# Patient Record
Sex: Female | Born: 1970 | Race: Black or African American | Hispanic: No | Marital: Married | Smoking: Never smoker
Health system: Southern US, Community
[De-identification: ages and names within clinical notes are randomized; demographics above are authoritative.]

## PROBLEM LIST (undated history)

## (undated) ENCOUNTER — Ambulatory Visit: Admission: EM | Payer: 59

## (undated) DIAGNOSIS — M1990 Unspecified osteoarthritis, unspecified site: Secondary | ICD-10-CM

## (undated) DIAGNOSIS — I10 Essential (primary) hypertension: Secondary | ICD-10-CM

## (undated) DIAGNOSIS — N200 Calculus of kidney: Secondary | ICD-10-CM

## (undated) DIAGNOSIS — E785 Hyperlipidemia, unspecified: Secondary | ICD-10-CM

## (undated) DIAGNOSIS — G8929 Other chronic pain: Secondary | ICD-10-CM

## (undated) DIAGNOSIS — J45909 Unspecified asthma, uncomplicated: Secondary | ICD-10-CM

## (undated) HISTORY — PX: ABDOMINAL HYSTERECTOMY: SHX81

## (undated) HISTORY — DX: Hyperlipidemia, unspecified: E78.5

## (undated) HISTORY — DX: Essential (primary) hypertension: I10

## (undated) HISTORY — DX: Calculus of kidney: N20.0

## (undated) HISTORY — DX: Other chronic pain: G89.29

---

## 2016-05-02 HISTORY — PX: ABDOMINAL HYSTERECTOMY: SHX81

## 2018-10-08 LAB — CBC AND DIFFERENTIAL
HCT: 35 — AB (ref 36–46)
Hemoglobin: 11.7 — AB (ref 12.0–16.0)
Neutrophils Absolute: 4
Platelets: 347 (ref 150–399)
WBC: 7.6

## 2018-10-08 LAB — HEPATIC FUNCTION PANEL
ALT: 6 — AB (ref 7–35)
AST: 15 (ref 13–35)
Bilirubin, Total: 0.2

## 2018-10-08 LAB — LIPID PANEL
Cholesterol: 227 — AB (ref 0–200)
HDL: 62 (ref 35–70)
LDL Cholesterol: 143
Triglycerides: 110 (ref 40–160)

## 2018-10-08 LAB — BASIC METABOLIC PANEL
BUN: 11 (ref 4–21)
CO2: 21 (ref 13–22)
Chloride: 103 (ref 99–108)
Creatinine: 0.8 (ref 0.5–1.1)
Potassium: 3.8 (ref 3.4–5.3)
Sodium: 139 (ref 137–147)

## 2018-10-08 LAB — HEMOGLOBIN A1C: Hemoglobin A1C: 6

## 2018-10-08 LAB — CBC: RBC: 4.17 (ref 3.87–5.11)

## 2018-10-08 LAB — VITAMIN B12: Vitamin B-12: 342

## 2018-10-08 LAB — TSH: TSH: 3.61 (ref 0.41–5.90)

## 2018-10-17 LAB — HM MAMMOGRAPHY

## 2019-12-10 ENCOUNTER — Other Ambulatory Visit: Payer: Self-pay

## 2019-12-10 ENCOUNTER — Ambulatory Visit (INDEPENDENT_AMBULATORY_CARE_PROVIDER_SITE_OTHER): Payer: No Typology Code available for payment source

## 2019-12-10 ENCOUNTER — Ambulatory Visit
Admission: RE | Admit: 2019-12-10 | Discharge: 2019-12-10 | Disposition: A | Discharge status: Home / Self Care | Payer: No Typology Code available for payment source | Source: Ambulatory Visit | Attending: Emergency Medicine | Admitting: Emergency Medicine

## 2019-12-10 VITALS — BP 130/95 | HR 94 | Temp 98.9°F | Resp 18 | Ht 69.0 in | Wt 245.0 lb

## 2019-12-10 DIAGNOSIS — N200 Calculus of kidney: Secondary | ICD-10-CM

## 2019-12-10 DIAGNOSIS — M545 Low back pain, unspecified: Secondary | ICD-10-CM

## 2019-12-10 LAB — URINALYSIS, COMPLETE (UACMP) WITH MICROSCOPIC
Bilirubin Urine: NEGATIVE
Glucose, UA: NEGATIVE mg/dL
Ketones, ur: NEGATIVE mg/dL
Leukocytes,Ua: NEGATIVE
Nitrite: NEGATIVE
Protein, ur: 100 mg/dL — AB
Specific Gravity, Urine: 1.02 (ref 1.005–1.030)
pH: 5.5 (ref 5.0–8.0)

## 2019-12-10 MED ORDER — KETOROLAC TROMETHAMINE 60 MG/2ML IM SOLN
30.0000 mg | Freq: Once | INTRAMUSCULAR | Status: AC
  Administered 2019-12-10: 30 mg via INTRAMUSCULAR

## 2019-12-10 MED ORDER — IBUPROFEN 600 MG PO TABS
600.0000 mg | ORAL_TABLET | Freq: Four times a day (QID) | ORAL | 0 refills | Status: DC | PRN
Start: 2019-12-10 — End: 2019-12-24

## 2019-12-10 MED ORDER — ACETAMINOPHEN 500 MG PO TABS
1000.0000 mg | ORAL_TABLET | Freq: Once | ORAL | Status: AC
  Administered 2019-12-10: 1000 mg via ORAL

## 2019-12-10 MED ORDER — TAMSULOSIN HCL 0.4 MG PO CAPS
0.4000 mg | ORAL_CAPSULE | Freq: Every day | ORAL | 0 refills | Status: AC
Start: 2019-12-10 — End: 2019-12-17

## 2019-12-10 NOTE — ED Triage Notes (Signed)
Patient c/o left side back pain that radiates into her left leg x 1 week. She denies injury.

## 2019-12-10 NOTE — Discharge Instructions (Addendum)
Take Tylenol 1000 mg with 600 mg of ibuprofen together 3-4 times a day as needed for pain.  Flomax will help you pass stones.  Strain your urine.  Follow-up with urology if you continue to have problems.  Push fluids.  Start MiraLAX, Colace.  This will help with your constipation.

## 2019-12-10 NOTE — ED Provider Notes (Signed)
HPI  SUBJECTIVE:  Kendra Ford is a 49 y.o. female who presents with 6 days of left lower back pain that has migrated down her left flank to the suprapubic/left lower quadrant region starting 6 days ago.  She states that she has had pain radiating down her lateral leg 3 days ago.  She states it is throbbing, constant, but it waxes and wanes.  She describes the pain in the suprapubic/left lower quadrant region as a cramp.  She reports urinary frequency, cloudy urine.  She reports nausea and one episode of emesis.  No dysuria, urgency, odorous urine, hematuria.  No fevers.  No saddle anesthesia, urinary, fecal incontinence, urinary retention.  No bilateral radicular pain, leg weakness.  No trauma to her back.  No change in her physical activities, recent heavy lifting.  She has ongoing issues with constipation.  had a bowel movement yesterday and states that it made the back pain better.  No antipyretic in the past 6 hours.  She has tried Flexeril and Tylenol without improvement in her symptoms.  Symptoms are better with defecating, walking, warm compresses, worse with sitting.  No pain worse at night, unintentional weight loss, night sweats.  She has had similar pain before, but is usually on the right/midline.  She has a history of back injury, she is status post hysterectomy.  She has a history of constipation, hypertension, UTI, nephrolithiasis.  No history of diabetes, pyelonephritis, cancer, multiple myeloma, aortic abdominal aneurysm.  PMD: She is establishing care with somebody in several weeks.    History reviewed. No pertinent past medical history.  Past Surgical History:  Procedure Laterality Date  . ABDOMINAL HYSTERECTOMY      History reviewed. No pertinent family history.  Social History   Tobacco Use  . Smoking status: Never Smoker  . Smokeless tobacco: Never Used  Vaping Use  . Vaping Use: Never used  Substance Use Topics  . Alcohol use: Never  . Drug use: Never    No  current facility-administered medications for this encounter.  Current Outpatient Medications:  .  ibuprofen (ADVIL) 600 MG tablet, Take 1 tablet (600 mg total) by mouth every 6 (six) hours as needed., Disp: 30 tablet, Rfl: 0 .  tamsulosin (FLOMAX) 0.4 MG CAPS capsule, Take 1 capsule (0.4 mg total) by mouth at bedtime for 7 days., Disp: 7 capsule, Rfl: 0  Allergies  Allergen Reactions  . Aspirin   . Penicillins      ROS  As noted in HPI.   Physical Exam  BP (!) 130/95 (BP Location: Right Arm)   Pulse 94   Temp 98.9 F (37.2 C) (Oral)   Resp 18   Ht _0  (1.753 m)   Wt 111.1 kg   SpO2 100%   BMI 36.18 kg/m   Constitutional: Well developed, well nourished, no acute distress Eyes:  EOMI, conjunctiva normal bilaterally HENT: Normocephalic, atraumatic,mucus membranes moist Respiratory: Normal inspiratory effort Cardiovascular: Normal rate GI: nondistended. No suprapubic tenderness.  Positive left flank, left lower quadrant tenderness.  No guarding, rebound skin: No rash, skin intact Musculoskeletal: Questionable left CVAT. + Left paralumbar tenderness,  - muscle spasm. No bony tenderness. Bilateral lower extremities nontender, baseline ROM with intact DP  pulses, no pain with int/ext rotation flex/extension hips bilaterally except pain aggravated with left hip flexion against resistance.. SLR neg bilaterally. Sensation baseline light touch bilaterally for Pt, DTR's symmetric and intact bilaterally KJ , Motor symmetric bilateral 5/5 hip flexion, quadriceps, hamstrings, EHL, foot dorsiflexion, foot plantarflexion,  gait normal  neurologic: Alert & oriented x 3, no focal neuro deficits Psychiatric: Speech and behavior appropriate   ED Course   Medications  acetaminophen (TYLENOL) tablet 1,000 mg (1,000 mg Oral Given 12/10/19 1032)  ketorolac (TORADOL) injection 30 mg (30 mg Intramuscular Given 12/10/19 1031)    Orders Placed This Encounter  Procedures  . Urine culture     Standing Status:   Standing    Number of Occurrences:   1  . DG Abdomen 1 View    Standing Status:   Standing    Number of Occurrences:   1    Order Specific Question:   Reason for Exam (SYMPTOM  OR DIAGNOSIS REQUIRED)    Answer:   r/o L nephrolithiasis    Order Specific Question:   Is patient pregnant?    Answer:   No  . Urinalysis, Complete w Microscopic    Standing Status:   Standing    Number of Occurrences:   1  . Strain all urine    Send pt home with urine strainer    Results for orders placed or performed during the hospital encounter of 12/10/19 (from the past 24 hour(s))  Urinalysis, Complete w Microscopic Urine, Clean Catch     Status: Abnormal   Collection Time: 12/10/19 10:39 AM  Result Value Ref Range   Color, Urine YELLOW YELLOW   APPearance HAZY (A) CLEAR   Specific Gravity, Urine 1.020 1.005 - 1.030   pH 5.5 5.0 - 8.0   Glucose, UA NEGATIVE NEGATIVE mg/dL   Hgb urine dipstick MODERATE (A) NEGATIVE   Bilirubin Urine NEGATIVE NEGATIVE   Ketones, ur NEGATIVE NEGATIVE mg/dL   Protein, ur 100 (A) NEGATIVE mg/dL   Nitrite NEGATIVE NEGATIVE   Leukocytes,Ua NEGATIVE NEGATIVE   Squamous Epithelial / LPF 6-10 0 - 5   WBC, UA 6-10 0 - 5 WBC/hpf   RBC / HPF 6-10 0 - 5 RBC/hpf   Bacteria, UA FEW (A) NONE SEEN   WBC Clumps PRESENT    Mucus PRESENT    DG Abdomen 1 View  Result Date: 12/10/2019 CLINICAL DATA:  Left flank pain for 1 week. EXAM: ABDOMEN - 1 VIEW COMPARISON:  None. FINDINGS: Moderate stool in the right colon. Moderate gas in the splenic flexure region. No dilated loops of small bowel to suggest obstruction. The soft tissue shadows of the abdomen are maintained. Suspect bilateral renal calculi. Calcifications along the left side of the lower spine appear to have lucent centers and are likely phleboliths in the ovarian vein. These are un likely ureteral calculi. The lung bases are clear. The heart appears mildly enlarged. Intact bony structures. IMPRESSION: 1.  Suspect bilateral renal calculi. 2. Probable phleboliths in the left ovarian vein. 3. No findings for obstruction or perforation. Electronically Signed   By: Marijo Sanes M.D.   On: 12/10/2019 10:51    ED Clinical Impression  1. Nephrolithiasis   2. Acute left-sided low back pain without sciatica     ED Assessment/Plan   Appears uncomfortable.  She has questionable left CVA tenderness.  She has left paralumbar tenderness, flank tenderness, suprapubic tenderness.  Mild left lateral thigh tenderness.  It seems to musculoskeletal as movement of the hip reproduces her pain however she also has urinary complaints.  She also states that her symptoms get better with defecation so I wonder if there is not a component of constipation.  We will get a KUB to evaluate constipation and for nephrolithiasis, a UA.  Doubt  obstructing nephrolithiasis, perforation.  Do not think that we need a CT at this point in time.  Giving Toradol 30 mg IM and Tylenol 1000 mg p.o.  Will reevaluate.  Imaging independently reviewed.  Suspected bilateral renal calculi.  Probable phleboliths in left ovarian vein.  No evidence of obstruction, perforation see radiology report for details  On reevaluation, patient states her symptoms have significantly resolved with Toradol/Tylenol.  She feels much better.  She has moderate hematuria and proteinuria in her urine.  She has a few bacteria, but this was contaminated specimen.  We will send this off for culture prior to initiating antibiotic therapy.  suspect multiple factors causing her symptoms-primarily nonobstructing nephrolithiasis with musculoskeletal back pain . sounds as if there is some constipation as well.  Will send home with Flomax, IBU/APAP together 3-4 times a day, strain urine, MiraLAX, advise Colace.  Push fluids.  Pt to f/u with  PMD and urology as necessary.  Discussed labs, medical decision-making, and plan for follow-up with the patient.  Discussed signs and  symptoms that should prompt return to the emergency department.  Patient agrees with plan.  Meds ordered this encounter  Medications  . acetaminophen (TYLENOL) tablet 1,000 mg  . ketorolac (TORADOL) injection 30 mg  . tamsulosin (FLOMAX) 0.4 MG CAPS capsule    Sig: Take 1 capsule (0.4 mg total) by mouth at bedtime for 7 days.    Dispense:  7 capsule    Refill:  0  . ibuprofen (ADVIL) 600 MG tablet    Sig: Take 1 tablet (600 mg total) by mouth every 6 (six) hours as needed.    Dispense:  30 tablet    Refill:  0    *This clinic note was created using [employer]. Therefore, there may be occasional mistakes despite careful proofreading.  ?     Melynda Ripple, MD 12/11/19 1437

## 2019-12-11 LAB — URINE CULTURE

## 2019-12-16 ENCOUNTER — Telehealth (HOSPITAL_COMMUNITY): Payer: Self-pay | Admitting: Emergency Medicine

## 2019-12-16 NOTE — Telephone Encounter (Signed)
Per patient's urine culture, multiple species present.  Called to check in on patient who states the pain has resolved, but she is continuing to have urinary frequency.  This RN encouraged her to return to provide urine sample for recollection.  States she will be here in the morning of 12/17/19.

## 2019-12-19 ENCOUNTER — Ambulatory Visit
Admission: EM | Admit: 2019-12-19 | Discharge: 2019-12-19 | Disposition: A | Discharge status: Home / Self Care | Payer: No Typology Code available for payment source | Attending: Internal Medicine | Admitting: Internal Medicine

## 2019-12-19 DIAGNOSIS — N200 Calculus of kidney: Secondary | ICD-10-CM | POA: Diagnosis present

## 2019-12-19 NOTE — ED Triage Notes (Signed)
Patient here for urine collection only. Was called back to come in for another urine specimen.

## 2019-12-21 LAB — URINE CULTURE

## 2019-12-24 ENCOUNTER — Ambulatory Visit (INDEPENDENT_AMBULATORY_CARE_PROVIDER_SITE_OTHER): Payer: No Typology Code available for payment source | Admitting: Internal Medicine

## 2019-12-24 ENCOUNTER — Other Ambulatory Visit: Payer: Self-pay

## 2019-12-24 ENCOUNTER — Telehealth: Payer: Self-pay

## 2019-12-24 ENCOUNTER — Encounter: Payer: Self-pay | Admitting: Internal Medicine

## 2019-12-24 VITALS — BP 130/70 | HR 76 | Temp 98.2°F | Ht 69.0 in | Wt 241.0 lb

## 2019-12-24 DIAGNOSIS — K5904 Chronic idiopathic constipation: Secondary | ICD-10-CM | POA: Insufficient documentation

## 2019-12-24 DIAGNOSIS — Z6835 Body mass index (BMI) 35.0-35.9, adult: Secondary | ICD-10-CM | POA: Insufficient documentation

## 2019-12-24 DIAGNOSIS — E782 Mixed hyperlipidemia: Secondary | ICD-10-CM

## 2019-12-24 DIAGNOSIS — K5900 Constipation, unspecified: Secondary | ICD-10-CM

## 2019-12-24 DIAGNOSIS — Z1211 Encounter for screening for malignant neoplasm of colon: Secondary | ICD-10-CM

## 2019-12-24 DIAGNOSIS — M1712 Unilateral primary osteoarthritis, left knee: Secondary | ICD-10-CM | POA: Insufficient documentation

## 2019-12-24 DIAGNOSIS — Z87442 Personal history of urinary calculi: Secondary | ICD-10-CM | POA: Diagnosis not present

## 2019-12-24 DIAGNOSIS — J4520 Mild intermittent asthma, uncomplicated: Secondary | ICD-10-CM | POA: Insufficient documentation

## 2019-12-24 DIAGNOSIS — R5383 Other fatigue: Secondary | ICD-10-CM

## 2019-12-24 DIAGNOSIS — Z87898 Personal history of other specified conditions: Secondary | ICD-10-CM | POA: Insufficient documentation

## 2019-12-24 NOTE — Telephone Encounter (Signed)
Precharted patients medications, history, social history, and allergies before her NP visit today.   Confirmed her appt time with her. She is new to [place], and Moved from [place] this past June 2021.  CM

## 2019-12-24 NOTE — Progress Notes (Signed)
Date:  12/24/2019   Name:  Kendra Ford   DOB:  [birth year]   MRN:  [id number]  New patient to establish care. Chief Complaint: Establish Care (New pt in [place]. From [place]. )  Hypertension This is a new problem. The problem has been rapidly improving since onset. The problem is controlled. Associated symptoms include palpitations. Pertinent negatives include no chest pain, headaches or shortness of breath. Past treatments include lifestyle changes. The current treatment provides significant improvement.  Hyperlipidemia This is a chronic problem. Pertinent negatives include no chest pain or shortness of breath. She is currently on no antihyperlipidemic treatment (previously on medication but stopped it).  Asthma She complains of chest tightness and wheezing. There is no shortness of breath. This is a recurrent problem. The problem occurs rarely. Pertinent negatives include no chest pain, fever or headaches. Her symptoms are not alleviated by beta-agonist. Her past medical history is significant for asthma.  Renal stones - recently in UC, given pain meds, pain resolved so she believes that she passed a stone.  No hematuria or cloudy urine.   Immunization History  Administered Date(s) Administered   PFIZER SARS-COV-2 Vaccination 12/01/2019, 12/20/2019  patient is not interested in Shingrix vaccine.   Review of Systems  Constitutional: Positive for fatigue. Negative for chills, fever and unexpected weight change.  Respiratory: Positive for wheezing. Negative for chest tightness and shortness of breath.   Cardiovascular: Positive for palpitations. Negative for chest pain and leg swelling.  Gastrointestinal: Positive for constipation. Negative for abdominal pain and diarrhea.  Musculoskeletal: Positive for arthralgias (left knee).  Neurological: Negative for dizziness, light-headedness and headaches.  Psychiatric/Behavioral: Negative for dysphoric mood and sleep disturbance. The patient  is not nervous/anxious.     Patient Active Problem List   Diagnosis Date Noted   History of renal calculi 12/24/2019    Allergies  Allergen Reactions   Aspirin Hives   Penicillins Hives    Past Surgical History:  Procedure Laterality Date   ABDOMINAL HYSTERECTOMY  2018   Partial - still have ovaries    Social History   Tobacco Use   Smoking status: Never Smoker   Smokeless tobacco: Never Used  Vaping Use   Vaping Use: Never used  Substance Use Topics   Alcohol use: Never   Drug use: Never     Medication list has been reviewed and updated.  No outpatient medications have been marked as taking for the 12/24/19 encounter (Office Visit) with Glean Hess, MD.    [facility] 2/9 Scores 12/24/2019  PHQ - 2 Score 0  PHQ- 9 Score 6    GAD 7 : Generalized Anxiety Score 12/24/2019  Nervous, Anxious, on Edge 0  Control/stop worrying 0  Worry too much - different things 0  Trouble relaxing 0  Restless 0  Easily annoyed or irritable 0  Afraid - awful might happen 0  Total GAD 7 Score 0  Anxiety Difficulty Not difficult at all    BP Readings from Last 3 Encounters:  12/24/19 130/70  12/10/19 (!) 130/95    Physical Exam Vitals and nursing note reviewed.  Constitutional:      General: She is not in acute distress.    Appearance: Normal appearance. She is well-developed.  HENT:     Head: Normocephalic and atraumatic.  Neck:     Vascular: No carotid bruit.  Cardiovascular:     Rate and Rhythm: Normal rate and regular rhythm. Frequent extrasystoles are present.    Pulses: Normal  pulses.     Heart sounds: No murmur heard.   Pulmonary:     Effort: Pulmonary effort is normal. No respiratory distress.     Breath sounds: No wheezing or rhonchi.  Musculoskeletal:     Cervical back: Normal range of motion.     Right knee: No effusion or bony tenderness. Normal range of motion. No tenderness.     Left knee: Effusion present. No bony tenderness. Normal range of  motion. No tenderness.     Right lower leg: No edema.     Left lower leg: No edema.  Lymphadenopathy:     Cervical: No cervical adenopathy.  Skin:    General: Skin is warm and dry.     Findings: No rash.  Neurological:     General: No focal deficit present.     Mental Status: She is alert and oriented to person, place, and time.  Psychiatric:        Attention and Perception: Attention normal.        Mood and Affect: Mood normal.        Behavior: Behavior normal.        Thought Content: Thought content normal.     Wt Readings from Last 3 Encounters:  12/24/19 241 lb (109.3 kg)  12/10/19 245 lb (111.1 kg)    BP 130/70    Pulse 76    Temp 98.2 F (36.8 C) (Oral)    Ht 5\' 9"  (1.753 m)    Wt 241 lb (109.3 kg)    SpO2 98%    BMI 35.59 kg/m   Assessment and Plan: 1. History of renal calculi Appears to have passed a stone and is now asx  2. Mild intermittent asthma without complication Continue albuterol inhaler as needed - Comprehensive metabolic panel  3. Constipation, unspecified constipation type Recommend Miralax daily Refer for Colonoscopy  4. Primary osteoarthritis of left knee Tylenol PRN  5. Mixed hyperlipidemia Check labs and advise - Lipid panel  6. Colon cancer screening - Ambulatory referral to Gastroenterology  7. Fatigue, unspecified type - CBC with Differential/Platelet - TSH  8. BMI 35.0-35.9,adult Recommend continuing work on diet; begin regular exercise such as walking during lunch daily  HM - will refer for Mammogram once previous films are received.  Partially dictated using Editor, commissioning. Any errors are unintentional.  Halina Maidens, MD [facility]  12/24/2019

## 2019-12-25 LAB — COMPREHENSIVE METABOLIC PANEL
ALT: 4 IU/L (ref 0–32)
AST: 13 IU/L (ref 0–40)
Albumin/Globulin Ratio: 1.1 — ABNORMAL LOW (ref 1.2–2.2)
Albumin: 3.8 g/dL (ref 3.8–4.9)
Alkaline Phosphatase: 68 IU/L (ref 48–121)
BUN/Creatinine Ratio: 12 (ref 9–23)
BUN: 11 mg/dL (ref 6–24)
Bilirubin Total: <0.2 mg/dL (ref 0.0–1.2)
CO2: 24 mmol/L (ref 20–29)
Calcium: 9.5 mg/dL (ref 8.7–10.2)
Chloride: 105 mmol/L (ref 96–106)
Creatinine, Ser: 0.89 mg/dL (ref 0.57–1.00)
GFR calc Af Amer: 87 mL/min/{1.73_m2} (ref >59)
GFR calc non Af Amer: 75 mL/min/{1.73_m2} (ref >59)
Globulin, Total: 3.4 g/dL (ref 1.5–4.5)
Glucose: 97 mg/dL (ref 65–99)
Potassium: 4.6 mmol/L (ref 3.5–5.2)
Sodium: 142 mmol/L (ref 134–144)
Total Protein: 7.2 g/dL (ref 6.0–8.5)

## 2019-12-25 LAB — CBC WITH DIFFERENTIAL/PLATELET
Basophils Absolute: 0 10*3/uL (ref 0.0–0.2)
Basos: 0 %
EOS (ABSOLUTE): 0.1 10*3/uL (ref 0.0–0.4)
Eos: 2 %
Hematocrit: 34.8 % (ref 34.0–46.6)
Hemoglobin: 11.3 g/dL (ref 11.1–15.9)
Immature Grans (Abs): 0 10*3/uL (ref 0.0–0.1)
Immature Granulocytes: 0 %
Lymphocytes Absolute: 2.4 10*3/uL (ref 0.7–3.1)
Lymphs: 37 %
MCH: 27.6 pg (ref 26.6–33.0)
MCHC: 32.5 g/dL (ref 31.5–35.7)
MCV: 85 fL (ref 79–97)
Monocytes Absolute: 0.4 10*3/uL (ref 0.1–0.9)
Monocytes: 7 %
Neutrophils Absolute: 3.6 10*3/uL (ref 1.4–7.0)
Neutrophils: 54 %
Platelets: 353 10*3/uL (ref 150–450)
RBC: 4.1 x10E6/uL (ref 3.77–5.28)
RDW: 14.7 % (ref 11.7–15.4)
WBC: 6.6 10*3/uL (ref 3.4–10.8)

## 2019-12-25 LAB — LIPID PANEL
Chol/HDL Ratio: 3.7 ratio (ref 0.0–4.4)
Cholesterol, Total: 229 mg/dL — ABNORMAL HIGH (ref 100–199)
HDL: 62 mg/dL (ref >39)
LDL Chol Calc (NIH): 151 mg/dL — ABNORMAL HIGH (ref 0–99)
Triglycerides: 93 mg/dL (ref 0–149)
VLDL Cholesterol Cal: 16 mg/dL (ref 5–40)

## 2019-12-25 LAB — TSH: TSH: 2.18 u[IU]/mL (ref 0.450–4.500)

## 2020-01-01 ENCOUNTER — Other Ambulatory Visit: Payer: Self-pay

## 2020-01-01 ENCOUNTER — Telehealth (INDEPENDENT_AMBULATORY_CARE_PROVIDER_SITE_OTHER): Payer: Self-pay | Admitting: Gastroenterology

## 2020-01-01 DIAGNOSIS — Z1211 Encounter for screening for malignant neoplasm of colon: Secondary | ICD-10-CM

## 2020-01-01 MED ORDER — PEG 3350-KCL-NA BICARB-NACL 420 G PO SOLR
4000.0000 mL | Freq: Once | ORAL | 0 refills | Status: AC
Start: 2020-01-01 — End: 2020-01-01

## 2020-01-01 NOTE — Progress Notes (Signed)
Gastroenterology Pre-Procedure Review  Request Date: Friday 01/24/20 Requesting Physician: Dr. Allen Norris  PATIENT REVIEW QUESTIONS: The patient responded to the following health history questions as indicated:    1. Are you having any GI issues? no 2. Do you have a personal history of Polyps? no 3. Do you have a family history of Colon Cancer or Polyps? no 4. Diabetes Mellitus? no 5. Joint replacements in the past 12 months?no 6. Major health problems in the past 3 months?no 7. Any artificial heart valves, MVP, or defibrillator?no    MEDICATIONS & ALLERGIES:    Patient reports the following regarding taking any anticoagulation/antiplatelet therapy:   Plavix, Coumadin, Eliquis, Xarelto, Lovenox, Pradaxa, Brilinta, or Effient? no Aspirin? no  Patient confirms/reports the following medications:  Current Outpatient Medications  Medication Sig Dispense Refill  . polyethylene glycol-electrolytes (NULYTELY) 420 g solution Take 4,000 mLs by mouth once for 1 dose. 4000 mL 0   No current facility-administered medications for this visit.    Patient confirms/reports the following allergies:  Allergies  Allergen Reactions  . Aspirin Hives  . Penicillins Hives    No orders of the defined types were placed in this encounter.   AUTHORIZATION INFORMATION Primary Insurance: 1D#: Group #:  Secondary Insurance: 1D#: Group #:  SCHEDULE INFORMATION: Date: Friday 01/24/20 Time: Location:[facility]

## 2020-01-16 ENCOUNTER — Encounter: Payer: Self-pay | Admitting: Gastroenterology

## 2020-01-16 NOTE — Anesthesia Preprocedure Evaluation (Addendum)
Anesthesia Evaluation  Patient identified by MRN, date of birth, ID band Patient awake    Reviewed: Allergy & Precautions, NPO status , Patient's Chart, lab work & pertinent test results, reviewed documented beta blocker date and time   History of Anesthesia Complications Negative for: history of anesthetic complications  Airway Mallampati: III  TM Distance: >3 FB Neck ROM: Full    Dental   Pulmonary asthma ,    breath sounds clear to auscultation       Cardiovascular hypertension, (-) angina(-) DOE  Rhythm:Regular Rate:Normal   HLD   Neuro/Psych    GI/Hepatic neg GERD  ,  Endo/Other    Renal/GU Renal disease (Stones)     Musculoskeletal  (+) Arthritis ,   Abdominal (+) + obese (BMI 35),   Peds  Hematology   Anesthesia Other Findings   Reproductive/Obstetrics                            Anesthesia Physical Anesthesia Plan  ASA: II  Anesthesia Plan: General   Post-op Pain Management:    Induction: Intravenous  PONV Risk Score and Plan: 3 and Propofol infusion, TIVA and Treatment may vary due to age or medical condition  Airway Management Planned: Natural Airway and Nasal Cannula  Additional Equipment:   Intra-op Plan:   Post-operative Plan:   Informed Consent: I have reviewed the patients History and Physical, chart, labs and discussed the procedure including the risks, benefits and alternatives for the proposed anesthesia with the patient or authorized representative who has indicated his/her understanding and acceptance.       Plan Discussed with: CRNA and Anesthesiologist  Anesthesia Plan Comments:         Anesthesia Quick Evaluation

## 2020-01-22 ENCOUNTER — Other Ambulatory Visit
Admission: RE | Admit: 2020-01-22 | Discharge: 2020-01-22 | Disposition: A | Discharge status: Home / Self Care | Payer: No Typology Code available for payment source | Source: Ambulatory Visit | Attending: Gastroenterology | Admitting: Gastroenterology

## 2020-01-22 ENCOUNTER — Other Ambulatory Visit: Payer: Self-pay

## 2020-01-22 DIAGNOSIS — Z01812 Encounter for preprocedural laboratory examination: Secondary | ICD-10-CM | POA: Insufficient documentation

## 2020-01-22 DIAGNOSIS — Z20822 Contact with and (suspected) exposure to covid-19: Secondary | ICD-10-CM | POA: Diagnosis not present

## 2020-01-22 LAB — SARS CORONAVIRUS 2 (TAT 6-24 HRS): SARS Coronavirus 2: NEGATIVE

## 2020-01-23 NOTE — Discharge Instructions (Signed)
General Anesthesia, Adult, Care After This sheet gives you information about how to care for yourself after your procedure. Your health care provider may also give you more specific instructions. If you have problems or questions, contact your health care provider. What can I expect after the procedure? After the procedure, the following side effects are common:  Pain or discomfort at the IV site.  Nausea.  Vomiting.  Sore throat.  Trouble concentrating.  Feeling cold or chills.  Weak or tired.  Sleepiness and fatigue.  Soreness and body aches. These side effects can affect parts of the body that were not involved in surgery. Follow these instructions at home:  For at least 24 hours after the procedure:  Have a responsible adult stay with you. It is important to have someone help care for you until you are awake and alert.  Rest as needed.  Do not: ? Participate in activities in which you could fall or become injured. ? Drive. ? Use heavy machinery. ? Drink alcohol. ? Take sleeping pills or medicines that cause drowsiness. ? Make important decisions or sign legal documents. ? Take care of children on your own. Eating and drinking  Follow any instructions from your health care provider about eating or drinking restrictions.  When you feel hungry, start by eating small amounts of foods that are soft and easy to digest (bland), such as toast. Gradually return to your regular diet.  Drink enough fluid to keep your urine pale yellow.  If you vomit, rehydrate by drinking water, juice, or clear broth. General instructions  If you have sleep apnea, surgery and certain medicines can increase your risk for breathing problems. Follow instructions from your health care provider about wearing your sleep device: ? Anytime you are sleeping, including during daytime naps. ? While taking prescription pain medicines, sleeping medicines, or medicines that make you drowsy.  Return to  your normal activities as told by your health care provider. Ask your health care provider what activities are safe for you.  Take over-the-counter and prescription medicines only as told by your health care provider.  If you smoke, do not smoke without supervision.  Keep all follow-up visits as told by your health care provider. This is important. Contact a health care provider if:  You have nausea or vomiting that does not get better with medicine.  You cannot eat or drink without vomiting.  You have pain that does not get better with medicine.  You are unable to pass urine.  You develop a skin rash.  You have a fever.  You have redness around your IV site that gets worse. Get help right away if:  You have difficulty breathing.  You have chest pain.  You have blood in your urine or stool, or you vomit blood. Summary  After the procedure, it is common to have a sore throat or nausea. It is also common to feel tired.  Have a responsible adult stay with you for the first 24 hours after general anesthesia. It is important to have someone help care for you until you are awake and alert.  When you feel hungry, start by eating small amounts of foods that are soft and easy to digest (bland), such as toast. Gradually return to your regular diet.  Drink enough fluid to keep your urine pale yellow.  Return to your normal activities as told by your health care provider. Ask your health care provider what activities are safe for you. This information is not   intended to replace advice given to you by your health care provider. Make sure you discuss any questions you have with your health care provider. Document Revised: 04/21/2017 Document Reviewed: 12/02/2016 Elsevier Patient Education  2020 [employer]  

## 2020-01-24 ENCOUNTER — Ambulatory Visit
Admission: RE | Admit: 2020-01-24 | Discharge: 2020-01-24 | Disposition: A | Discharge status: Home / Self Care | Payer: 59 | Attending: Gastroenterology | Admitting: Gastroenterology

## 2020-01-24 ENCOUNTER — Ambulatory Visit: Payer: 59 | Admitting: Anesthesiology

## 2020-01-24 ENCOUNTER — Encounter
Admission: RE | Disposition: A | Discharge status: Home / Self Care | Payer: Self-pay | Source: Home / Self Care | Attending: Gastroenterology

## 2020-01-24 ENCOUNTER — Encounter: Payer: Self-pay | Admitting: Gastroenterology

## 2020-01-24 ENCOUNTER — Other Ambulatory Visit: Payer: Self-pay

## 2020-01-24 DIAGNOSIS — Z880 Allergy status to penicillin: Secondary | ICD-10-CM | POA: Insufficient documentation

## 2020-01-24 DIAGNOSIS — K635 Polyp of colon: Secondary | ICD-10-CM

## 2020-01-24 DIAGNOSIS — Z8249 Family history of ischemic heart disease and other diseases of the circulatory system: Secondary | ICD-10-CM | POA: Diagnosis not present

## 2020-01-24 DIAGNOSIS — Z1211 Encounter for screening for malignant neoplasm of colon: Secondary | ICD-10-CM | POA: Insufficient documentation

## 2020-01-24 DIAGNOSIS — J45909 Unspecified asthma, uncomplicated: Secondary | ICD-10-CM | POA: Diagnosis not present

## 2020-01-24 DIAGNOSIS — M1990 Unspecified osteoarthritis, unspecified site: Secondary | ICD-10-CM | POA: Insufficient documentation

## 2020-01-24 DIAGNOSIS — Z886 Allergy status to analgesic agent status: Secondary | ICD-10-CM | POA: Diagnosis not present

## 2020-01-24 DIAGNOSIS — K640 First degree hemorrhoids: Secondary | ICD-10-CM | POA: Insufficient documentation

## 2020-01-24 DIAGNOSIS — D120 Benign neoplasm of cecum: Secondary | ICD-10-CM | POA: Insufficient documentation

## 2020-01-24 DIAGNOSIS — I10 Essential (primary) hypertension: Secondary | ICD-10-CM | POA: Diagnosis not present

## 2020-01-24 HISTORY — DX: Unspecified asthma, uncomplicated: J45.909

## 2020-01-24 HISTORY — PX: COLONOSCOPY WITH PROPOFOL: SHX5780

## 2020-01-24 HISTORY — PX: POLYPECTOMY: SHX5525

## 2020-01-24 HISTORY — DX: Unspecified osteoarthritis, unspecified site: M19.90

## 2020-01-24 SURGERY — COLONOSCOPY WITH PROPOFOL
Anesthesia: General | Site: Rectum

## 2020-01-24 MED ORDER — LIDOCAINE HCL (CARDIAC) PF 100 MG/5ML IV SOSY
PREFILLED_SYRINGE | INTRAVENOUS | Status: DC | PRN
  Administered 2020-01-24: 50 mg via INTRAVENOUS

## 2020-01-24 MED ORDER — ONDANSETRON HCL 4 MG/2ML IJ SOLN: 4.0000 mg | Freq: Once | INTRAMUSCULAR | Status: DC | PRN

## 2020-01-24 MED ORDER — SODIUM CHLORIDE 0.9 % IV SOLN: INTRAVENOUS | Status: DC

## 2020-01-24 MED ORDER — PROPOFOL 10 MG/ML IV BOLUS
INTRAVENOUS | Status: DC | PRN
  Administered 2020-01-24: 20 mg via INTRAVENOUS
  Administered 2020-01-24 (×2): 30 mg via INTRAVENOUS
  Administered 2020-01-24: 70 mg via INTRAVENOUS
  Administered 2020-01-24: 30 mg via INTRAVENOUS
  Administered 2020-01-24: 40 mg via INTRAVENOUS
  Administered 2020-01-24: 20 mg via INTRAVENOUS
  Administered 2020-01-24: 30 mg via INTRAVENOUS
  Administered 2020-01-24 (×2): 20 mg via INTRAVENOUS

## 2020-01-24 MED ORDER — LACTATED RINGERS IV SOLN: INTRAVENOUS | Status: DC

## 2020-01-24 MED ORDER — STERILE WATER FOR IRRIGATION IR SOLN
Status: DC | PRN
  Administered 2020-01-24: 150 mL

## 2020-01-24 MED ORDER — ACETAMINOPHEN 10 MG/ML IV SOLN: 1000.0000 mg | Freq: Once | INTRAVENOUS | Status: DC | PRN

## 2020-01-24 SURGICAL SUPPLY — 6 items
FORCEPS BIOP RAD 4 LRG CAP 4 (CUTTING FORCEPS) ×4 IMPLANT
GOWN CVR UNV OPN BCK APRN NK (MISCELLANEOUS) ×4 IMPLANT
GOWN ISOL THUMB LOOP REG UNIV (MISCELLANEOUS) ×8
KIT ENDO PROCEDURE OLY (KITS) ×4 IMPLANT
MANIFOLD NEPTUNE II (INSTRUMENTS) ×4 IMPLANT
WATER STERILE IRR 250ML POUR (IV SOLUTION) ×4 IMPLANT

## 2020-01-24 NOTE — Anesthesia Procedure Notes (Signed)
Performed by: Beula Joyner, CRNA Pre-anesthesia Checklist: Patient identified, Emergency Drugs available, Suction available, Timeout performed and Patient being monitored Patient Re-evaluated:Patient Re-evaluated prior to induction Oxygen Delivery Method: Nasal cannula Placement Confirmation: positive ETCO2       

## 2020-01-24 NOTE — Anesthesia Postprocedure Evaluation (Signed)
Anesthesia Post Note  Patient: Kendra Ford  Procedure(s) Performed: COLONOSCOPY WITH PROPOFOL (N/A ) POLYPECTOMY (Rectum)     Patient location during evaluation: PACU Anesthesia Type: General Level of consciousness: awake and alert Pain management: pain level controlled Vital Signs Assessment: post-procedure vital signs reviewed and stable Respiratory status: spontaneous breathing, nonlabored ventilation, respiratory function stable and patient connected to nasal cannula oxygen Cardiovascular status: blood pressure returned to baseline and stable Postop Assessment: no apparent nausea or vomiting Anesthetic complications: no   No complications documented.  Dalton Molesworth A  Sterlin Knightly

## 2020-01-24 NOTE — Op Note (Signed)
[facility] Gastroenterology Patient Name: Kendra Ford Procedure Date: 01/24/2020 9:00 AM MRN: [id number] Account #: [id number] Date of Birth: August 27, 1970 Admit Type: Outpatient Age: 49 Room: [facility] OR ROOM 01 Gender: Female Note Status: Finalized Procedure:             Colonoscopy Indications:           Screening for colorectal malignant neoplasm Providers:             Lucilla Lame MD, MD Referring MD:          Halina Maidens, MD (Referring MD) Medicines:             Propofol per Anesthesia Complications:         No immediate complications. Procedure:             Pre-Anesthesia Assessment:                        - Prior to the procedure, a History and Physical was                         performed, and patient medications and allergies were                         reviewed. The patient's tolerance of previous                         anesthesia was also reviewed. The risks and benefits                         of the procedure and the sedation options and risks                         were discussed with the patient. All questions were                         answered, and informed consent was obtained. Prior                         Anticoagulants: The patient has taken no previous                         anticoagulant or antiplatelet agents. ASA Grade                         Assessment: II - A patient with mild systemic disease.                         After reviewing the risks and benefits, the patient                         was deemed in satisfactory condition to undergo the                         procedure.                        After obtaining informed consent, the colonoscope was  passed under direct vision. Throughout the procedure,                         the patient's blood pressure, pulse, and oxygen                         saturations were monitored continuously. The                         Colonoscope was introduced through the  anus and                         advanced to the the cecum, identified by appendiceal                         orifice and ileocecal valve. The colonoscopy was                         performed without difficulty. The patient tolerated                         the procedure well. The quality of the bowel                         preparation was good. Findings:      The perianal and digital rectal examinations were normal.      A 3 mm polyp was found in the cecum. The polyp was sessile. The polyp       was removed with a cold biopsy forceps. Resection and retrieval were       complete.      Non-bleeding internal hemorrhoids were found during retroflexion. The       hemorrhoids were Grade I (internal hemorrhoids that do not prolapse). Impression:            - One 3 mm polyp in the cecum, removed with a cold                         biopsy forceps. Resected and retrieved.                        - Non-bleeding internal hemorrhoids. Recommendation:        - Discharge patient to home.                        - Resume previous diet.                        - Continue present medications.                        - Await pathology results.                        - Repeat colonoscopy in 7 years if polyp adenoma and                         10 years if hyperplastic Procedure Code(s):     --- Professional ---  45380, Colonoscopy, flexible; with biopsy, single or                         multiple Diagnosis Code(s):     --- Professional ---                        Z12.11, Encounter for screening for malignant neoplasm                         of colon                        K63.5, Polyp of colon CPT copyright 2019 American Medical Association. All rights reserved. The codes documented in this report are preliminary and upon coder review may  be revised to meet current compliance requirements. Lucilla Lame MD, MD 01/24/2020 9:25:32 AM This report has been signed electronically. Number of  Addenda: 0 Note Initiated On: 01/24/2020 9:00 AM Scope Withdrawal Time: 0 hours 11 minutes 34 seconds  Total Procedure Duration: 0 hours 15 minutes 49 seconds  Estimated Blood Loss:  Estimated blood loss: none.      [facility]

## 2020-01-24 NOTE — H&P (Signed)
Lucilla Lame, MD [facility] [address], [place], [place] [address] Phone: [phone] Fax : [phone]  Primary Care Physician:  Glean Hess, MD Primary Gastroenterologist:  Dr. Allen Norris  Pre-Procedure History & Physical: HPI:  Kendra Ford is a 49 y.o. female is here for a screening colonoscopy.   Past Medical History:  Diagnosis Date  . Arthritis    left knee  . Asthma   . Chronic back pain   . Hyperlipidemia   . Hypertension   . Recurrent kidney stones     Past Surgical History:  Procedure Laterality Date  . ABDOMINAL HYSTERECTOMY  2018   fibroids/bleeding - ovaries remain    Prior to Admission medications   Not on File    Allergies as of 01/01/2020 - Review Complete 01/01/2020  Allergen Reaction Noted  . Aspirin Hives 12/10/2019  . Penicillins Hives 12/10/2019    Family History  Problem Relation Age of Onset  . Hypertension Mother   . Hyperlipidemia Mother   . Liver disease Mother     Social History   Socioeconomic History  . Marital status: Married    Spouse name: Not on file  . Number of children: Not on file  . Years of education: Not on file  . Highest education level: Not on file  Occupational History  . Not on file  Tobacco Use  . Smoking status: Never Smoker  . Smokeless tobacco: Never Used  Vaping Use  . Vaping Use: Never used  Substance and Sexual Activity  . Alcohol use: Never  . Drug use: Never  . Sexual activity: Yes  Other Topics Concern  . Not on file  Social History Narrative  . Not on file   Social Determinants of Health   Financial Resource Strain:   . Difficulty of Paying Living Expenses: Not on file  Food Insecurity:   . Worried About Charity fundraiser in the Last Year: Not on file  . Ran Out of Food in the Last Year: Not on file  Transportation Needs:   . Lack of Transportation (Medical): Not on file  . Lack of Transportation (Non-Medical): Not on file  Physical Activity:   . Days of Exercise per  Week: Not on file  . Minutes of Exercise per Session: Not on file  Stress:   . Feeling of Stress : Not on file  Social Connections:   . Frequency of Communication with Friends and Family: Not on file  . Frequency of Social Gatherings with Friends and Family: Not on file  . Attends Religious Services: Not on file  . Active Member of Clubs or Organizations: Not on file  . Attends Archivist Meetings: Not on file  . Marital Status: Not on file  Intimate Partner Violence:   . Fear of Current or Ex-Partner: Not on file  . Emotionally Abused: Not on file  . Physically Abused: Not on file  . Sexually Abused: Not on file    Review of Systems: See HPI, otherwise negative ROS  Physical Exam: BP 139/68   Pulse 73   Temp 97.7 F (36.5 C) (Temporal)   Resp 16   Ht 5\' 9"  (1.753 m)   Wt 108.9 kg   SpO2 100%   BMI 35.44 kg/m  General:   Alert,  pleasant and cooperative in NAD Head:  Normocephalic and atraumatic. Neck:  Supple; no masses or thyromegaly. Lungs:  Clear throughout to auscultation.    Heart:  Regular rate and rhythm. Abdomen:  Soft, nontender and nondistended. Normal bowel sounds, without guarding, and without rebound.   Neurologic:  Alert and  oriented x4;  grossly normal neurologically.  Impression/Plan: Kendra Ford is now here to undergo a screening colonoscopy.  Risks, benefits, and alternatives regarding colonoscopy have been reviewed with the patient.  Questions have been answered.  All parties agreeable.

## 2020-01-24 NOTE — Transfer of Care (Signed)
Immediate Anesthesia Transfer of Care Note  Patient: Kendra Ford  Procedure(s) Performed: COLONOSCOPY WITH PROPOFOL (N/A ) POLYPECTOMY (Rectum)  Patient Location: PACU  Anesthesia Type: General  Level of Consciousness: awake, alert  and patient cooperative  Airway and Oxygen Therapy: Patient Spontanous Breathing and Patient connected to supplemental oxygen  Post-op Assessment: Post-op Vital signs reviewed, Patient's Cardiovascular Status Stable, Respiratory Function Stable, Patent Airway and No signs of Nausea or vomiting  Post-op Vital Signs: Reviewed and stable  Complications: No complications documented.

## 2020-01-27 ENCOUNTER — Encounter: Payer: Self-pay | Admitting: Gastroenterology

## 2020-01-27 LAB — SURGICAL PATHOLOGY

## 2020-01-28 ENCOUNTER — Encounter: Payer: Self-pay | Admitting: Internal Medicine

## 2020-01-28 ENCOUNTER — Other Ambulatory Visit: Payer: Self-pay | Admitting: Internal Medicine

## 2020-01-28 ENCOUNTER — Encounter: Payer: Self-pay | Admitting: Gastroenterology

## 2020-01-28 DIAGNOSIS — Z1231 Encounter for screening mammogram for malignant neoplasm of breast: Secondary | ICD-10-CM

## 2020-01-29 ENCOUNTER — Ambulatory Visit: Admit: 2020-01-29 | Discharge status: Absent without leave | Payer: No Typology Code available for payment source

## 2020-02-05 ENCOUNTER — Other Ambulatory Visit: Payer: Self-pay

## 2020-02-05 ENCOUNTER — Ambulatory Visit
Admission: RE | Admit: 2020-02-05 | Discharge: 2020-02-05 | Disposition: A | Discharge status: Home / Self Care | Payer: No Typology Code available for payment source | Source: Ambulatory Visit | Attending: Internal Medicine | Admitting: Internal Medicine

## 2020-02-05 DIAGNOSIS — Z1231 Encounter for screening mammogram for malignant neoplasm of breast: Secondary | ICD-10-CM | POA: Diagnosis not present

## 2020-02-20 ENCOUNTER — Other Ambulatory Visit: Payer: Self-pay | Admitting: Internal Medicine

## 2020-02-20 DIAGNOSIS — N6310 Unspecified lump in the right breast, unspecified quadrant: Secondary | ICD-10-CM

## 2020-02-20 DIAGNOSIS — R928 Other abnormal and inconclusive findings on diagnostic imaging of breast: Secondary | ICD-10-CM

## 2020-04-26 ENCOUNTER — Encounter: Payer: Self-pay | Admitting: Internal Medicine

## 2020-04-26 DIAGNOSIS — Z90710 Acquired absence of both cervix and uterus: Secondary | ICD-10-CM | POA: Insufficient documentation

## 2020-04-27 ENCOUNTER — Ambulatory Visit (INDEPENDENT_AMBULATORY_CARE_PROVIDER_SITE_OTHER): Payer: No Typology Code available for payment source | Admitting: Internal Medicine

## 2020-04-27 ENCOUNTER — Encounter: Payer: Self-pay | Admitting: Internal Medicine

## 2020-04-27 ENCOUNTER — Other Ambulatory Visit: Payer: Self-pay

## 2020-04-27 VITALS — BP 136/84 | HR 72 | Temp 98.4°F | Ht 69.0 in | Wt 242.0 lb

## 2020-04-27 DIAGNOSIS — L309 Dermatitis, unspecified: Secondary | ICD-10-CM | POA: Diagnosis not present

## 2020-04-27 DIAGNOSIS — F3342 Major depressive disorder, recurrent, in full remission: Secondary | ICD-10-CM | POA: Insufficient documentation

## 2020-04-27 DIAGNOSIS — Z0000 Encounter for general adult medical examination without abnormal findings: Secondary | ICD-10-CM | POA: Diagnosis not present

## 2020-04-27 DIAGNOSIS — Z6835 Body mass index (BMI) 35.0-35.9, adult: Secondary | ICD-10-CM

## 2020-04-27 DIAGNOSIS — J4520 Mild intermittent asthma, uncomplicated: Secondary | ICD-10-CM | POA: Diagnosis not present

## 2020-04-27 DIAGNOSIS — E782 Mixed hyperlipidemia: Secondary | ICD-10-CM

## 2020-04-27 DIAGNOSIS — Z1159 Encounter for screening for other viral diseases: Secondary | ICD-10-CM

## 2020-04-27 MED ORDER — MOMETASONE FUROATE 0.1 % EX OINT: TOPICAL_OINTMENT | Freq: Every day | CUTANEOUS | 0 refills | Status: DC

## 2020-04-27 NOTE — Progress Notes (Addendum)
Date:  04/27/2020   Name:  Kendra Ford   DOB:  09-02-1970   MRN:  [id number]   Chief Complaint: Annual Exam (Breast exam no pap)  Kendra Ford is a 49 y.o. female who presents today for her Complete Annual Exam. She feels well. She reports exercising walking x3 days a week. She reports she is sleeping well. Breast complaints none.  Mammogram: 01/2020 DEXA: none Pap smear: discontinued Colonoscopy: 01/2020 repeat 7 years  Immunization History  Administered Date(s) Administered  . PFIZER SARS-COV-2 Vaccination 12/01/2019, 12/20/2019  Declined Flu vaccine  Depression        This is a recurrent problem.  The current episode started more than 1 year ago.   The onset quality is sudden.   The problem has been resolved since onset.  Associated symptoms include no fatigue and no headaches.  Past treatments include SSRIs - Selective serotonin reuptake inhibitors.  Compliance with treatment is good.   Lab Results  Component Value Date   CREATININE 0.89 12/24/2019   BUN 11 12/24/2019   NA 142 12/24/2019   K 4.6 12/24/2019   CL 105 12/24/2019   CO2 24 12/24/2019   Lab Results  Component Value Date   CHOL 229 (H) 12/24/2019   HDL 62 12/24/2019   LDLCALC 151 (H) 12/24/2019   TRIG 93 12/24/2019   CHOLHDL 3.7 12/24/2019   Lab Results  Component Value Date   TSH 2.180 12/24/2019   Lab Results  Component Value Date   HGBA1C 6.0 10/08/2018   Lab Results  Component Value Date   WBC 6.6 12/24/2019   HGB 11.3 12/24/2019   HCT 34.8 12/24/2019   MCV 85 12/24/2019   PLT 353 12/24/2019   Lab Results  Component Value Date   ALT 4 12/24/2019   AST 13 12/24/2019   ALKPHOS 68 12/24/2019   BILITOT <0.2 12/24/2019     Review of Systems  Constitutional: Negative for chills, fatigue and fever.  HENT: Negative for congestion, hearing loss, tinnitus, trouble swallowing and voice change.   Eyes: Negative for visual disturbance.  Respiratory: Negative for cough, chest tightness,  shortness of breath and wheezing.   Cardiovascular: Negative for chest pain, palpitations and leg swelling.  Gastrointestinal: Negative for abdominal pain, constipation, diarrhea and vomiting.  Endocrine: Negative for polydipsia and polyuria.  Genitourinary: Negative for dysuria, frequency, genital sores, vaginal bleeding and vaginal discharge.  Musculoskeletal: Negative for arthralgias, gait problem and joint swelling.  Skin: Negative for color change and rash.       New moles on leg   Neurological: Negative for dizziness, tremors, light-headedness and headaches.  Hematological: Negative for adenopathy. Does not bruise/bleed easily.  Psychiatric/Behavioral: Positive for depression. Negative for dysphoric mood and sleep disturbance. The patient is not nervous/anxious.     Patient Active Problem List   Diagnosis Date Noted  . S/P TAH (total abdominal hysterectomy) 04/26/2020  . Encounter for screening colonoscopy   . Polyp of colon   . History of renal calculi 12/24/2019  . Mild intermittent asthma without complication 54/27/0623  . Constipation 12/24/2019  . Primary osteoarthritis of left knee 12/24/2019  . Mixed hyperlipidemia 12/24/2019  . BMI 35.0-35.9,adult 12/24/2019  . History of palpitations 12/24/2019    Allergies  Allergen Reactions  . Aspirin Hives  . Penicillins Hives    Past Surgical History:  Procedure Laterality Date  . ABDOMINAL HYSTERECTOMY  2018   fibroids/bleeding - ovaries remain  . COLONOSCOPY WITH PROPOFOL N/A 01/24/2020   Procedure:  COLONOSCOPY WITH PROPOFOL;  Surgeon: Lucilla Lame, MD;  Location: [facility];  Service: Endoscopy;  Laterality: N/A;  priority 4  . POLYPECTOMY  01/24/2020   Procedure: POLYPECTOMY;  Surgeon: Lucilla Lame, MD;  Location: [facility];  Service: Endoscopy;;    Social History   Tobacco Use  . Smoking status: Never Smoker  . Smokeless tobacco: Never Used  Vaping Use  . Vaping Use: Never used  Substance  Use Topics  . Alcohol use: Never  . Drug use: Never     Medication list has been reviewed and updated.  Current Meds  Medication Sig  . Rivaroxaban (XARELTO PO) Take by mouth at bedtime. [place] 2/9 Scores 04/27/2020 12/24/2019  PHQ - 2 Score 0 0  PHQ- 9 Score 0 6    GAD 7 : Generalized Anxiety Score 04/27/2020 12/24/2019  Nervous, Anxious, on Edge 1 0  Control/stop worrying 1 0  Worry too much - different things 1 0  Trouble relaxing 0 0  Restless 0 0  Easily annoyed or irritable 1 0  Afraid - awful might happen 1 0  Total GAD 7 Score 5 0  Anxiety Difficulty - Not difficult at all    BP Readings from Last 3 Encounters:  04/27/20 136/84  01/24/20 139/87  12/24/19 130/70    Physical Exam Vitals and nursing note reviewed.  Constitutional:      General: She is not in acute distress.    Appearance: She is well-developed.  HENT:     Head: Normocephalic and atraumatic.     Right Ear: Tympanic membrane and ear canal normal.     Left Ear: Tympanic membrane and ear canal normal.     Nose:     Right Sinus: No maxillary sinus tenderness.     Left Sinus: No maxillary sinus tenderness.  Eyes:     General: No scleral icterus.       Right eye: No discharge.        Left eye: No discharge.     Conjunctiva/sclera: Conjunctivae normal.  Neck:     Thyroid: No thyromegaly.     Vascular: No carotid bruit.  Cardiovascular:     Rate and Rhythm: Normal rate and regular rhythm.     Pulses: Normal pulses.     Heart sounds: Normal heart sounds.  Pulmonary:     Effort: Pulmonary effort is normal. No respiratory distress.     Breath sounds: No wheezing.  Chest:  Breasts:     Right: No mass, nipple discharge, skin change or tenderness.     Left: No mass, nipple discharge, skin change or tenderness.    Abdominal:     General: Bowel sounds are normal.     Palpations: Abdomen is soft.     Tenderness: There is no abdominal tenderness.  Musculoskeletal:        General:  Normal range of motion.     Cervical back: Normal range of motion. No erythema.     Right lower leg: No edema.     Left lower leg: No edema.  Lymphadenopathy:     Cervical: No cervical adenopathy.  Skin:    General: Skin is warm and dry.     Capillary Refill: Capillary refill takes less than 2 seconds.     Findings: No rash.     Comments: Three new dark flat macules on left thigh  Neurological:     General: No focal deficit present.  Mental Status: She is alert and oriented to person, place, and time.     Cranial Nerves: No cranial nerve deficit.     Sensory: No sensory deficit.     Deep Tendon Reflexes: Reflexes are normal and symmetric.  Psychiatric:        Attention and Perception: Attention normal.        Mood and Affect: Mood normal.        Behavior: Behavior normal.     Wt Readings from Last 3 Encounters:  04/27/20 242 lb (109.8 kg)  01/24/20 240 lb (108.9 kg)  12/24/19 241 lb (109.3 kg)    BP 136/84   Pulse 72   Temp 98.4 F (36.9 C) (Oral)   Ht 5\' 9"  (1.753 m)   Wt 242 lb (109.8 kg)   SpO2 100%   BMI 35.74 kg/m   Assessment and Plan: 1. Annual physical exam Exam is normal except for weight. Encourage regular exercise and appropriate dietary changes. Try programs like [employer] or [employer]. Recommend Dermatology evaluation - Dr. Phillip Heal - Comprehensive metabolic panel  2. Mixed hyperlipidemia - Lipid panel  3. Mild intermittent asthma without complication - CBC with Differential/Platelet  4. Need for hepatitis C screening test - Hepatitis C antibody  5. Recurrent major depressive disorder, in full remission ([place]) Clinically stable on current regimen with good control of symptoms, No SI or HI. Will continue current therapy - call for refill when needed - TSH  6. Eczema, unspecified type - mometasone (ELOCON) 0.1 % ointment; Apply topically daily.  Dispense: 45 g; Refill: 0  7. BMI 35.0-35.9,adult - Hemoglobin A1c   Partially dictated using Radio producer. Any errors are unintentional.  Halina Maidens, MD [facility]  04/27/2020

## 2020-04-27 NOTE — Patient Instructions (Signed)
[employer] -   [employer] -

## 2020-04-28 ENCOUNTER — Encounter: Payer: Self-pay | Admitting: Internal Medicine

## 2020-04-28 DIAGNOSIS — R7303 Prediabetes: Secondary | ICD-10-CM | POA: Insufficient documentation

## 2020-04-28 LAB — COMPREHENSIVE METABOLIC PANEL
ALT: 7 IU/L (ref 0–32)
AST: 16 IU/L (ref 0–40)
Albumin/Globulin Ratio: 1.3 (ref 1.2–2.2)
Albumin: 4.3 g/dL (ref 3.8–4.9)
Alkaline Phosphatase: 86 IU/L (ref 44–121)
BUN/Creatinine Ratio: 9 (ref 9–23)
BUN: 9 mg/dL (ref 6–24)
Bilirubin Total: 0.2 mg/dL (ref 0.0–1.2)
CO2: 24 mmol/L (ref 20–29)
Calcium: 9.6 mg/dL (ref 8.7–10.2)
Chloride: 100 mmol/L (ref 96–106)
Creatinine, Ser: 0.98 mg/dL (ref 0.57–1.00)
GFR calc Af Amer: 77 mL/min/{1.73_m2} (ref >59)
GFR calc non Af Amer: 67 mL/min/{1.73_m2} (ref >59)
Globulin, Total: 3.2 g/dL (ref 1.5–4.5)
Glucose: 101 mg/dL — ABNORMAL HIGH (ref 65–99)
Potassium: 4.2 mmol/L (ref 3.5–5.2)
Sodium: 139 mmol/L (ref 134–144)
Total Protein: 7.5 g/dL (ref 6.0–8.5)

## 2020-04-28 LAB — CBC WITH DIFFERENTIAL/PLATELET
Basophils Absolute: 0 10*3/uL (ref 0.0–0.2)
Basos: 0 %
EOS (ABSOLUTE): 0.1 10*3/uL (ref 0.0–0.4)
Eos: 2 %
Hematocrit: 38 % (ref 34.0–46.6)
Hemoglobin: 12.6 g/dL (ref 11.1–15.9)
Immature Grans (Abs): 0 10*3/uL (ref 0.0–0.1)
Immature Granulocytes: 0 %
Lymphocytes Absolute: 2.7 10*3/uL (ref 0.7–3.1)
Lymphs: 48 %
MCH: 27.8 pg (ref 26.6–33.0)
MCHC: 33.2 g/dL (ref 31.5–35.7)
MCV: 84 fL (ref 79–97)
Monocytes Absolute: 0.4 10*3/uL (ref 0.1–0.9)
Monocytes: 7 %
Neutrophils Absolute: 2.4 10*3/uL (ref 1.4–7.0)
Neutrophils: 43 %
Platelets: 305 10*3/uL (ref 150–450)
RBC: 4.53 x10E6/uL (ref 3.77–5.28)
RDW: 13.9 % (ref 11.7–15.4)
WBC: 5.7 10*3/uL (ref 3.4–10.8)

## 2020-04-28 LAB — LIPID PANEL
Chol/HDL Ratio: 2.9 ratio (ref 0.0–4.4)
Cholesterol, Total: 186 mg/dL (ref 100–199)
HDL: 65 mg/dL (ref >39)
LDL Chol Calc (NIH): 97 mg/dL (ref 0–99)
Triglycerides: 140 mg/dL (ref 0–149)
VLDL Cholesterol Cal: 24 mg/dL (ref 5–40)

## 2020-04-28 LAB — HEMOGLOBIN A1C
Est. average glucose Bld gHb Est-mCnc: 126 mg/dL
Hgb A1c MFr Bld: 6 % — ABNORMAL HIGH (ref 4.8–5.6)

## 2020-04-28 LAB — HEPATITIS C ANTIBODY: Hep C Virus Ab: 0.2 s/co ratio (ref 0.0–0.9)

## 2020-04-28 LAB — TSH: TSH: 3.31 u[IU]/mL (ref 0.450–4.500)

## 2020-06-11 ENCOUNTER — Telehealth: Payer: Self-pay

## 2020-06-11 NOTE — Telephone Encounter (Unsigned)
Copied from [place] [id number]. Topic: Complaint - Billing/Coding >> Jun 10, 2020  5:18 PM Erick Blinks wrote: DOS: [id number] Details of complaint: Pt believes she was billed incorrectly  How would the patient like to see this issue resolved? Pt would like a call back from Practice Admin to discuss billing   Best contact: [phone]   Route to Engineer, building services.

## 2020-09-03 ENCOUNTER — Other Ambulatory Visit: Payer: Self-pay | Admitting: Unknown Physician Specialty

## 2020-09-03 ENCOUNTER — Other Ambulatory Visit: Payer: Self-pay | Admitting: Internal Medicine

## 2020-09-03 DIAGNOSIS — L309 Dermatitis, unspecified: Secondary | ICD-10-CM

## 2020-09-03 DIAGNOSIS — R1310 Dysphagia, unspecified: Secondary | ICD-10-CM

## 2020-09-03 MED ORDER — MOMETASONE FUROATE 0.1 % EX OINT: TOPICAL_OINTMENT | Freq: Every day | CUTANEOUS | 0 refills | Status: DC

## 2020-09-18 ENCOUNTER — Ambulatory Visit
Admission: RE | Admit: 2020-09-18 | Discharge: 2020-09-18 | Disposition: A | Discharge status: Home / Self Care | Payer: No Typology Code available for payment source | Source: Ambulatory Visit | Attending: Unknown Physician Specialty | Admitting: Unknown Physician Specialty

## 2020-09-18 ENCOUNTER — Other Ambulatory Visit: Payer: Self-pay

## 2020-09-18 DIAGNOSIS — R1310 Dysphagia, unspecified: Secondary | ICD-10-CM | POA: Diagnosis not present

## 2020-09-19 DIAGNOSIS — R1310 Dysphagia, unspecified: Secondary | ICD-10-CM | POA: Diagnosis not present

## 2020-09-19 NOTE — Progress Notes (Signed)
Modified Barium Swallow Progress Note  Patient Details  Name: Kendra Ford MRN: [id number] Date of Birth: 12-16-1970  Today's Date: 09/19/2020  Modified Barium Swallow completed.  Full report located under Chart Review in the Imaging Section.  Brief recommendations include the following:  Clinical Impression  Pt demonstrates adequate oropharyngeal abilities when consuming puree, solids, whole barium tablet and thin liquids via cup or straw. Pt's oral phase is unremarkable. Her pharyngeal phase is c/b intermittent swallow initiation at the level of pyriforms and one instance of discoordinated swallow initiation d/t "taste of that stuff." Even during this discoordinated swallow, pt maintained great airway protection. At the end of the study, pt reported having "the" globus sensation and pointed to her neck. Scan revealed bolus stasis in the mid-esophagus. Given that these two events coorelated, recommend GI follow up if pt desires. Extensive education provided on reflux precautions.   Swallow Evaluation Recommendations   Recommended Consults: Consider esophageal assessment   SLP Diet Recommendations: Regular solids;Thin liquid   Liquid Administration via: Cup;Straw   Medication Administration: Whole meds with liquid   Supervision: Patient able to self feed   Compensations: Minimize environmental distractions;Slow rate;Small sips/bites   Postural Changes: Seated upright at 90 degrees (follow strict reflux precautions)   Oral Care Recommendations: Oral care BID       Kendra Ford B. Rutherford Nail M.S., CCC-SLP, [facility] [phone]  Kendra Ford Rutherford Nail 09/19/2020,8:10 AM

## 2020-10-16 ENCOUNTER — Ambulatory Visit
Admission: EM | Admit: 2020-10-16 | Discharge: 2020-10-16 | Disposition: A | Discharge status: Home / Self Care | Payer: No Typology Code available for payment source | Attending: Emergency Medicine | Admitting: Emergency Medicine

## 2020-10-16 ENCOUNTER — Other Ambulatory Visit: Payer: Self-pay

## 2020-10-16 DIAGNOSIS — M5441 Lumbago with sciatica, right side: Secondary | ICD-10-CM | POA: Diagnosis not present

## 2020-10-16 DIAGNOSIS — M5442 Lumbago with sciatica, left side: Secondary | ICD-10-CM | POA: Diagnosis not present

## 2020-10-16 MED ORDER — PREDNISONE 10 MG (21) PO TBPK: ORAL_TABLET | ORAL | 0 refills | Status: DC

## 2020-10-16 MED ORDER — TRAMADOL HCL 50 MG PO TABS: 100.0000 mg | ORAL_TABLET | Freq: Four times a day (QID) | ORAL | 0 refills | Status: DC | PRN

## 2020-10-16 MED ORDER — BACLOFEN 10 MG PO TABS: 10.0000 mg | ORAL_TABLET | Freq: Three times a day (TID) | ORAL | 0 refills | Status: DC

## 2020-10-16 NOTE — Discharge Instructions (Addendum)
Starting tomorrow morning take the prednisone pack with breakfast.  You will take it each morning with breakfast for a period of 6 days.  Take the baclofen 3 times a day to help with muscle spasm.  Take the tramadol every 6 hours as needed for severe pain.  This will make you drowsy.  Do not drink alcohol or drive if you take it.  Continue to increase your water intake to help your body flush the lactic acid from your muscles.  Discussed with your employer about ergonomics evaluation to ensure that your workspace is not contributing to your back pain.  Follow the back exercises handout given in your discharge paperwork as these can be very helpful to help alleviate the spasm and relieve your pain.  Consider consulting a chiropractor about a possible pelvic or back adjustment.  You may also want to consider massage therapy as this can be very helpful in helping to alleviate spasm, promote muscle healing, and facilitate the removal of lactic acid which can be contributing to the muscle spasm and pain.

## 2020-10-16 NOTE — ED Provider Notes (Signed)
[facility]    CSN: [id number] Arrival date & time: 10/16/20  1626      History   Chief Complaint Chief Complaint  Patient presents with   Back Pain    HPI Kendra Ford is a 50 y.o. female.   HPI  50 year old female here for evaluation of low back pain.  Patient reports that she has been experiencing pain in her low back for last 2 and half to 3 weeks but her pain became much worse today.  She reports that she was sitting at home at her desk and it became too uncomfortable for her to sit down so she is been needing to stand.  She also reports that there has been increased pain with change of position.  She reports that the pain is all across her low back and goes down through her buttock on both sides and down the outside of her leg.  Her right leg goes all the way to her toes and her left leg the pain stops at her knee.  Past Medical History:  Diagnosis Date   Arthritis    left knee   Asthma    Chronic back pain    Hyperlipidemia    Hypertension    Recurrent kidney stones     Patient Active Problem List   Diagnosis Date Noted   Prediabetes 04/28/2020   Eczema 04/27/2020   Recurrent major depressive disorder, in full remission ([place]) 04/27/2020   S/P TAH (total abdominal hysterectomy) 04/26/2020   Encounter for screening colonoscopy    Polyp of colon    History of renal calculi 12/24/2019   Mild intermittent asthma without complication 93/81/0175   Constipation 12/24/2019   Primary osteoarthritis of left knee 12/24/2019   Mixed hyperlipidemia 12/24/2019   BMI 35.0-35.9,adult 12/24/2019   History of palpitations 12/24/2019    Past Surgical History:  Procedure Laterality Date   ABDOMINAL HYSTERECTOMY  2018   fibroids/bleeding - ovaries remain   COLONOSCOPY WITH PROPOFOL N/A 01/24/2020   Procedure: COLONOSCOPY WITH PROPOFOL;  Surgeon: Lucilla Lame, MD;  Location: [place];  Service: Endoscopy;  Laterality: N/A;  priority 4   POLYPECTOMY   01/24/2020   Procedure: POLYPECTOMY;  Surgeon: Lucilla Lame, MD;  Location: [place];  Service: Endoscopy;;    OB History   No obstetric history on file.      Home Medications    Prior to Admission medications   Medication Sig Start Date End Date Taking? Authorizing Provider  baclofen (LIORESAL) 10 MG tablet Take 1 tablet (10 mg total) by mouth 3 (three) times daily. 10/16/20  Yes Margarette Canada, NP  predniSONE (STERAPRED UNI-PAK 21 TAB) 10 MG (21) TBPK tablet Take 6 tablets on day 1, 5 tablets day 2, 4 tablets day 3, 3 tablets day 4, 2 tablets day 5, 1 tablet day 6 10/16/20  Yes Margarette Canada, NP  traMADol (ULTRAM) 50 MG tablet Take 2 tablets (100 mg total) by mouth every 6 (six) hours as needed. 10/16/20  Yes Margarette Canada, NP    Family History Family History  Problem Relation Age of Onset   Hypertension Mother    Hyperlipidemia Mother    Liver disease Mother    Breast cancer Neg Hx     Social History Social History   Tobacco Use   Smoking status: Never   Smokeless tobacco: Never  Vaping Use   Vaping Use: Never used  Substance Use Topics   Alcohol use: Never   Drug use: Never  Allergies   Aspirin and Penicillins   Review of Systems Review of Systems  Constitutional:  Negative for activity change, appetite change and fever.  Musculoskeletal:  Positive for back pain. Negative for arthralgias and myalgias.    Physical Exam Triage Vital Signs ED Triage Vitals [10/16/20 1645]  Enc Vitals Group     BP      Pulse      Resp      Temp      Temp src      SpO2      Weight 215 lb (97.5 kg)     Height 5\' 9"  (1.753 m)     Head Circumference      Peak Flow      Pain Score 10     Pain Loc      Pain Edu?      Excl. in [place]?    No data found.  Updated Vital Signs BP 140/79 (BP Location: Right Arm)   Pulse 79   Temp 98.1 F (36.7 C) (Oral)   Resp 18   Ht 5\' 9"  (1.753 m)   Wt 215 lb (97.5 kg)   SpO2 99%   BMI 31.75 kg/m   Visual Acuity Right Eye  Distance:   Left Eye Distance:   Bilateral Distance:    Right Eye Near:   Left Eye Near:    Bilateral Near:     Physical Exam Vitals and nursing note reviewed.  Constitutional:      General: She is not in acute distress.    Appearance: Normal appearance. She is obese. She is not ill-appearing.  HENT:     Head: Normocephalic and atraumatic.  Musculoskeletal:        General: Tenderness present. No deformity.  Skin:    General: Skin is warm and dry.     Capillary Refill: Capillary refill takes less than 2 seconds.     Findings: No erythema or rash.  Neurological:     General: No focal deficit present.     Mental Status: She is alert and oriented to person, place, and time.  Psychiatric:        Mood and Affect: Mood normal.        Behavior: Behavior normal.        Thought Content: Thought content normal.        Judgment: Judgment normal.     UC Treatments / Results  Labs (all labs ordered are listed, but only abnormal results are displayed) Labs Reviewed - No data to display  EKG   Radiology No results found.  Procedures Procedures (including critical care time)  Medications Ordered in UC Medications - No data to display  Initial Impression / Assessment and Plan / UC Course  I have reviewed the triage vital signs and the nursing notes.  Pertinent labs & imaging results that were available during my care of the patient were reviewed by me and considered in my medical decision making (see chart for details).  Patient is a nontoxic-appearing 50 year old female here for evaluation of low back pain with radiation through her buttock and into both legs.  Patient reports that she has been told she has sciatica in the past and this is similar to her previous outbreaks.  Patient's physical exam reveals bilateral paraspinous tenderness at the level of L5-S1 with reproducible pain diagonal through the right buttock but not the left.  Patient's bilateral lower extremity strength  is 5/5 and DTRs are 1+ bilaterally.  Patient's exam  is consistent with low back pain with bilateral sciatica.  We will treat with steroid Dosepak, baclofen, and tramadol.   Final Clinical Impressions(s) / UC Diagnoses   Final diagnoses:  Acute bilateral low back pain with bilateral sciatica     Discharge Instructions      Starting tomorrow morning take the prednisone pack with breakfast.  You will take it each morning with breakfast for a period of 6 days.  Take the baclofen 3 times a day to help with muscle spasm.  Take the tramadol every 6 hours as needed for severe pain.  This will make you drowsy.  Do not drink alcohol or drive if you take it.  Continue to increase your water intake to help your body flush the lactic acid from your muscles.  Discussed with your employer about ergonomics evaluation to ensure that your workspace is not contributing to your back pain.  Follow the back exercises handout given in your discharge paperwork as these can be very helpful to help alleviate the spasm and relieve your pain.  Consider consulting a chiropractor about a possible pelvic or back adjustment.  You may also want to consider massage therapy as this can be very helpful in helping to alleviate spasm, promote muscle healing, and facilitate the removal of lactic acid which can be contributing to the muscle spasm and pain.      ED Prescriptions     Medication Sig Dispense Auth. Provider   predniSONE (STERAPRED UNI-PAK 21 TAB) 10 MG (21) TBPK tablet Take 6 tablets on day 1, 5 tablets day 2, 4 tablets day 3, 3 tablets day 4, 2 tablets day 5, 1 tablet day 6 21 tablet Margarette Canada, NP   baclofen (LIORESAL) 10 MG tablet Take 1 tablet (10 mg total) by mouth 3 (three) times daily. 30 each Margarette Canada, NP   traMADol (ULTRAM) 50 MG tablet Take 2 tablets (100 mg total) by mouth every 6 (six) hours as needed. 15 tablet Margarette Canada, NP      I have reviewed the PDMP during this encounter.    Margarette Canada, NP 10/16/20 [phone]

## 2020-10-16 NOTE — ED Triage Notes (Signed)
Patient complains of low back pain that has been ongoing 2.5-3 weeks ago with stiffness. States that pain is now shooting down left buttocks. Patient states that pain worsened today. No known injury.

## 2020-10-23 ENCOUNTER — Encounter: Payer: Self-pay | Admitting: Internal Medicine

## 2020-10-23 ENCOUNTER — Other Ambulatory Visit: Payer: Self-pay

## 2020-10-23 ENCOUNTER — Ambulatory Visit (INDEPENDENT_AMBULATORY_CARE_PROVIDER_SITE_OTHER): Payer: No Typology Code available for payment source | Admitting: Internal Medicine

## 2020-10-23 VITALS — BP 124/82 | HR 71 | Temp 97.8°F | Ht 69.0 in | Wt 237.0 lb

## 2020-10-23 DIAGNOSIS — Z1231 Encounter for screening mammogram for malignant neoplasm of breast: Secondary | ICD-10-CM | POA: Diagnosis not present

## 2020-10-23 DIAGNOSIS — N644 Mastodynia: Secondary | ICD-10-CM

## 2020-10-23 NOTE — Progress Notes (Signed)
Date:  10/23/2020   Name:  Kendra Ford   DOB:  April 28, 1971   MRN:  [id number]   Chief Complaint: Breast Pain (X1 year on and off, Both sides, in the last month its been shooting burning pain that wakes her up out of her sleep, mole growing under left breast, no lumps or masses present )  HPI Breast pain - shooting pains in both breasts.  No mass or nipple discharge. She denies caffeine intake or use of other supplements.  No breast injury.  Last mammogram 01/2020.   Lab Results  Component Value Date   CREATININE 0.98 04/27/2020   BUN 9 04/27/2020   NA 139 04/27/2020   K 4.2 04/27/2020   CL 100 04/27/2020   CO2 24 04/27/2020   Lab Results  Component Value Date   CHOL 186 04/27/2020   HDL 65 04/27/2020   LDLCALC 97 04/27/2020   TRIG 140 04/27/2020   CHOLHDL 2.9 04/27/2020   Lab Results  Component Value Date   TSH 3.310 04/27/2020   Lab Results  Component Value Date   HGBA1C 6.0 (H) 04/27/2020   Lab Results  Component Value Date   WBC 5.7 04/27/2020   HGB 12.6 04/27/2020   HCT 38.0 04/27/2020   MCV 84 04/27/2020   PLT 305 04/27/2020   Lab Results  Component Value Date   ALT 7 04/27/2020   AST 16 04/27/2020   ALKPHOS 86 04/27/2020   BILITOT 0.2 04/27/2020     Review of Systems  Constitutional:  Negative for chills, fatigue and fever.  Respiratory:  Negative for chest tightness and shortness of breath.   Cardiovascular:  Negative for chest pain.  Genitourinary:        Breast pains  Neurological:  Negative for dizziness and headaches.   Patient Active Problem List   Diagnosis Date Noted   Prediabetes 04/28/2020   Eczema 04/27/2020   Recurrent major depressive disorder, in full remission ([place]) 04/27/2020   S/P TAH (total abdominal hysterectomy) 04/26/2020   Encounter for screening colonoscopy    Polyp of colon    History of renal calculi 12/24/2019   Mild intermittent asthma without complication 46/27/0350   Constipation 12/24/2019   Primary  osteoarthritis of left knee 12/24/2019   Mixed hyperlipidemia 12/24/2019   BMI 35.0-35.9,adult 12/24/2019   History of palpitations 12/24/2019    Allergies  Allergen Reactions   Aspirin Hives   Penicillins Hives    Past Surgical History:  Procedure Laterality Date   ABDOMINAL HYSTERECTOMY  2018   fibroids/bleeding - ovaries remain   COLONOSCOPY WITH PROPOFOL N/A 01/24/2020   Procedure: COLONOSCOPY WITH PROPOFOL;  Surgeon: Lucilla Lame, MD;  Location: [place];  Service: Endoscopy;  Laterality: N/A;  priority 4   POLYPECTOMY  01/24/2020   Procedure: POLYPECTOMY;  Surgeon: Lucilla Lame, MD;  Location: [facility];  Service: Endoscopy;;    Social History   Tobacco Use   Smoking status: Never   Smokeless tobacco: Never  Vaping Use   Vaping Use: Never used  Substance Use Topics   Alcohol use: Never   Drug use: Never     Medication list has been reviewed and updated.  Current Meds  Medication Sig   EPINEPHrine 0.3 mg/0.3 mL IJ SOAJ injection See admin instructions.   mometasone (ELOCON) 0.1 % lotion SMARTSIG:In Ear(s)   omeprazole (PRILOSEC) 40 MG capsule Take 1 capsule by mouth daily.   phentermine (ADIPEX-P) 37.5 MG tablet Take 37.5 mg by mouth daily.  traMADol (ULTRAM) 50 MG tablet Take 2 tablets (100 mg total) by mouth every 6 (six) hours as needed.   [DISCONTINUED] predniSONE (STERAPRED UNI-PAK 21 TAB) 10 MG (21) TBPK tablet Take 6 tablets on day 1, 5 tablets day 2, 4 tablets day 3, 3 tablets day 4, 2 tablets day 5, 1 tablet day 6    PHQ 2/9 Scores 10/23/2020 04/27/2020 12/24/2019  PHQ - 2 Score 0 0 0  PHQ- 9 Score 0 0 6    GAD 7 : Generalized Anxiety Score 10/23/2020 04/27/2020 12/24/2019  Nervous, Anxious, on Edge 0 1 0  Control/stop worrying 0 1 0  Worry too much - different things 0 1 0  Trouble relaxing 0 0 0  Restless 0 0 0  Easily annoyed or irritable 0 1 0  Afraid - awful might happen 0 1 0  Total GAD 7 Score 0 5 0  Anxiety Difficulty  - - Not difficult at all    BP Readings from Last 3 Encounters:  10/23/20 124/82  10/16/20 140/79  04/27/20 136/84    Physical Exam Vitals and nursing note reviewed.  Constitutional:      General: She is not in acute distress.    Appearance: She is well-developed.  HENT:     Head: Normocephalic and atraumatic.  Cardiovascular:     Rate and Rhythm: Normal rate and regular rhythm.  Pulmonary:     Effort: Pulmonary effort is normal. No respiratory distress.     Breath sounds: No wheezing or rhonchi.  Chest:  Breasts:    Right: No swelling, inverted nipple, mass, nipple discharge, skin change or tenderness.     Left: No swelling, inverted nipple, mass, nipple discharge, skin change or tenderness.  Skin:    General: Skin is warm and dry.     Findings: No rash.  Neurological:     Mental Status: She is alert and oriented to person, place, and time.  Psychiatric:        Mood and Affect: Mood normal.        Behavior: Behavior normal.    Wt Readings from Last 3 Encounters:  10/23/20 237 lb (107.5 kg)  10/16/20 215 lb (97.5 kg)  04/27/20 242 lb (109.8 kg)    BP 124/82   Pulse 71   Temp 97.8 F (36.6 C) (Oral)   Ht 5\' 9"  (1.753 m)   Wt 237 lb (107.5 kg)   SpO2 100%   BMI 35.00 kg/m   Assessment and Plan: 1. Breast pain in female Benign glandular breast discomfort of uncertain cause Exam is normal. Avoid caffeine and stimulants Vitamin E daily  2. Encounter for screening mammogram for breast cancer Schedule annual exam in October - MM 3D SCREEN BREAST BILATERAL; Future   Partially dictated using Editor, commissioning. Any errors are unintentional.  Halina Maidens, MD [facility]  10/23/2020

## 2020-10-23 NOTE — Patient Instructions (Signed)
Try Vitamin E daily for breast discomfort

## 2020-11-29 NOTE — H&P (View-Only) (Signed)
Primary Care Physician: Glean Hess, MD  Primary Gastroenterologist:  Dr. Lucilla Lame  Chief Complaint  Patient presents with  . New Patient (Initial Visit)    dysphagia    HPI: Kendra Ford is a 50 y.o. female here here with a history of having a colonoscopy by me back in September 2021.  At that time the patient was found to have:  - One 3 mm polyp in the cecum, removed with a cold biopsy forceps. Resected and retrieved. - Non-bleeding internal hemorrhoids.  If the pathology showing the polyp to be a tubular adenoma.  At that time the patient was recommended to have a repeat colonoscopy in 7 years.  The patient was then sent to me for dysphagia.  The patient has had dysphagia to both liquids and solids for over 2 years.  She also had been seen by ENT who recommended her to have a modified barium swallow.  The modified barium swallow showed: "Scan revealed bolus stasis in the mid-esophagus".  He was recommended due to these findings at the patient be followed up with GI for further evaluation.  The patient reports that she has lost weight but she is on a diet and has been trying to lose weight.  There is no report of any rectal bleeding but she does state her stools are dark.  She denies any NSAID use.  Past Medical History:  Diagnosis Date  . Arthritis    left knee  . Asthma   . Chronic back pain   . Hyperlipidemia   . Hypertension   . Recurrent kidney stones     Current Outpatient Medications  Medication Sig Dispense Refill  . EPINEPHrine 0.3 mg/0.3 mL IJ SOAJ injection See admin instructions.    . mometasone (ELOCON) 0.1 % lotion SMARTSIG:In Ear(s)    . omeprazole (PRILOSEC) 40 MG capsule Take 1 capsule by mouth daily.    . phentermine (ADIPEX-P) 37.5 MG tablet Take 37.5 mg by mouth daily.    . traMADol (ULTRAM) 50 MG tablet Take 2 tablets (100 mg total) by mouth every 6 (six) hours as needed. 15 tablet 0   No current facility-administered medications for this  visit.    Allergies as of 11/30/2020 - Review Complete 11/30/2020  Allergen Reaction Noted  . Aspirin Hives 12/10/2019  . Penicillins Hives 12/10/2019    ROS:  General: Negative for anorexia, weight loss, fever, chills, fatigue, weakness. ENT: Negative for hoarseness, difficulty swallowing , nasal congestion. CV: Negative for chest pain, angina, palpitations, dyspnea on exertion, peripheral edema.  Respiratory: Negative for dyspnea at rest, dyspnea on exertion, cough, sputum, wheezing.  GI: See history of present illness. GU:  Negative for dysuria, hematuria, urinary incontinence, urinary frequency, nocturnal urination.  Endo: Negative for unusual weight change.    Physical Examination:   BP (!) 142/75 (BP Location: Left Arm, Patient Position: Sitting, Cuff Size: Large)   Pulse 86   Temp (!) 97 F (36.1 C) (Temporal)   Ht '5\' 9"'$  (1.753 m)   Wt 234 lb 9.6 oz (106.4 kg)   BMI 34.64 kg/m   General: Well-nourished, well-developed in no acute distress.  Eyes: No icterus. Conjunctivae pink. Lungs: Clear to auscultation bilaterally. Non-labored. Heart: Regular rate and rhythm, no murmurs rubs or gallops.  Abdomen: Bowel sounds are normal, Mild epigastric Tenderness, nondistended, no hepatosplenomegaly or masses, no abdominal bruits or hernia , no rebound or guarding.   Extremities: No lower extremity edema. No clubbing or deformities. Neuro: Alert and  oriented x 3.  Grossly intact. Skin: Warm and dry, no jaundice.   Psych: Alert and cooperative, normal mood and affect.  Labs:    Imaging Studies: No results found.  Assessment and Plan:   Kendra Ford is a 50 y.o. y/o female who comes in today with a history of dysphagia with an abnormal modified barium swallow.  The patient has been explained the findings and she reports that she does much better when she is on her PPI. The patient has been told that she may need to take a PPI on a regular basis.  The patient will also be set up  for an upper endoscopy due to the abnormal finding on the modified barium swallow.  The patient has been explained the plan and agrees with it.     Lucilla Lame, MD. [facility]    Note: This dictation was prepared with Dragon dictation along with smaller phrase technology. Any transcriptional errors that result from this process are unintentional.

## 2020-11-29 NOTE — Progress Notes (Signed)
Primary Care Physician: Glean Hess, MD  Primary Gastroenterologist:  Dr. Lucilla Lame  Chief Complaint  Patient presents with   New Patient (Initial Visit)    dysphagia    HPI: Kendra Ford is a 50 y.o. female here here with a history of having a colonoscopy by me back in September 2021.  At that time the patient was found to have:  - One 3 mm polyp in the cecum, removed with a cold biopsy forceps. Resected and retrieved. - Non-bleeding internal hemorrhoids.  If the pathology showing the polyp to be a tubular adenoma.  At that time the patient was recommended to have a repeat colonoscopy in 7 years.  The patient was then sent to me for dysphagia.  The patient has had dysphagia to both liquids and solids for over 2 years.  She also had been seen by ENT who recommended her to have a modified barium swallow.  The modified barium swallow showed: "Scan revealed bolus stasis in the mid-esophagus".  He was recommended due to these findings at the patient be followed up with GI for further evaluation.  The patient reports that she has lost weight but she is on a diet and has been trying to lose weight.  There is no report of any rectal bleeding but she does state her stools are dark.  She denies any NSAID use.  Past Medical History:  Diagnosis Date   Arthritis    left knee   Asthma    Chronic back pain    Hyperlipidemia    Hypertension    Recurrent kidney stones     Current Outpatient Medications  Medication Sig Dispense Refill   EPINEPHrine 0.3 mg/0.3 mL IJ SOAJ injection See admin instructions.     mometasone (ELOCON) 0.1 % lotion SMARTSIG:In Ear(s)     omeprazole (PRILOSEC) 40 MG capsule Take 1 capsule by mouth daily.     phentermine (ADIPEX-P) 37.5 MG tablet Take 37.5 mg by mouth daily.     traMADol (ULTRAM) 50 MG tablet Take 2 tablets (100 mg total) by mouth every 6 (six) hours as needed. 15 tablet 0   No current facility-administered medications for this visit.     Allergies as of 11/30/2020 - Review Complete 11/30/2020  Allergen Reaction Noted   Aspirin Hives 12/10/2019   Penicillins Hives 12/10/2019    ROS:  General: Negative for anorexia, weight loss, fever, chills, fatigue, weakness. ENT: Negative for hoarseness, difficulty swallowing , nasal congestion. CV: Negative for chest pain, angina, palpitations, dyspnea on exertion, peripheral edema.  Respiratory: Negative for dyspnea at rest, dyspnea on exertion, cough, sputum, wheezing.  GI: See history of present illness. GU:  Negative for dysuria, hematuria, urinary incontinence, urinary frequency, nocturnal urination.  Endo: Negative for unusual weight change.    Physical Examination:   BP (!) 142/75 (BP Location: Left Arm, Patient Position: Sitting, Cuff Size: Large)   Pulse 86   Temp (!) 97 F (36.1 C) (Temporal)   Ht '5\' 9"'$  (1.753 m)   Wt 234 lb 9.6 oz (106.4 kg)   BMI 34.64 kg/m   General: Well-nourished, well-developed in no acute distress.  Eyes: No icterus. Conjunctivae pink. Lungs: Clear to auscultation bilaterally. Non-labored. Heart: Regular rate and rhythm, no murmurs rubs or gallops.  Abdomen: Bowel sounds are normal, Mild epigastric Tenderness, nondistended, no hepatosplenomegaly or masses, no abdominal bruits or hernia , no rebound or guarding.   Extremities: No lower extremity edema. No clubbing or deformities. Neuro: Alert and  oriented x 3.  Grossly intact. Skin: Warm and dry, no jaundice.   Psych: Alert and cooperative, normal mood and affect.  Labs:    Imaging Studies: No results found.  Assessment and Plan:   Kendra Ford is a 50 y.o. y/o female who comes in today with a history of dysphagia with an abnormal modified barium swallow.  The patient has been explained the findings and she reports that she does much better when she is on her PPI. The patient has been told that she may need to take a PPI on a regular basis.  The patient will also be set up for an  upper endoscopy due to the abnormal finding on the modified barium swallow.  The patient has been explained the plan and agrees with it.     Lucilla Lame, MD. [facility]    Note: This dictation was prepared with Dragon dictation along with smaller phrase technology. Any transcriptional errors that result from this process are unintentional.

## 2020-11-30 ENCOUNTER — Encounter: Payer: Self-pay | Admitting: Gastroenterology

## 2020-11-30 ENCOUNTER — Ambulatory Visit (INDEPENDENT_AMBULATORY_CARE_PROVIDER_SITE_OTHER): Payer: No Typology Code available for payment source | Admitting: Gastroenterology

## 2020-11-30 ENCOUNTER — Other Ambulatory Visit: Payer: Self-pay

## 2020-11-30 VITALS — BP 142/75 | HR 86 | Temp 97.0°F | Ht 69.0 in | Wt 234.6 lb

## 2020-11-30 DIAGNOSIS — R1310 Dysphagia, unspecified: Secondary | ICD-10-CM

## 2020-11-30 DIAGNOSIS — R1319 Other dysphagia: Secondary | ICD-10-CM

## 2020-12-02 ENCOUNTER — Encounter: Payer: Self-pay | Admitting: Gastroenterology

## 2020-12-10 ENCOUNTER — Encounter: Payer: Self-pay | Admitting: Gastroenterology

## 2020-12-10 ENCOUNTER — Ambulatory Visit
Admission: RE | Admit: 2020-12-10 | Discharge: 2020-12-10 | Disposition: A | Discharge status: Home / Self Care | Payer: No Typology Code available for payment source | Attending: Gastroenterology | Admitting: Gastroenterology

## 2020-12-10 ENCOUNTER — Other Ambulatory Visit: Payer: Self-pay

## 2020-12-10 ENCOUNTER — Ambulatory Visit: Payer: No Typology Code available for payment source | Admitting: Anesthesiology

## 2020-12-10 ENCOUNTER — Encounter
Admission: RE | Disposition: A | Discharge status: Home / Self Care | Payer: Self-pay | Source: Home / Self Care | Attending: Gastroenterology

## 2020-12-10 DIAGNOSIS — Z8249 Family history of ischemic heart disease and other diseases of the circulatory system: Secondary | ICD-10-CM | POA: Insufficient documentation

## 2020-12-10 DIAGNOSIS — Z880 Allergy status to penicillin: Secondary | ICD-10-CM | POA: Insufficient documentation

## 2020-12-10 DIAGNOSIS — E785 Hyperlipidemia, unspecified: Secondary | ICD-10-CM | POA: Diagnosis not present

## 2020-12-10 DIAGNOSIS — Z886 Allergy status to analgesic agent status: Secondary | ICD-10-CM | POA: Insufficient documentation

## 2020-12-10 DIAGNOSIS — Z79899 Other long term (current) drug therapy: Secondary | ICD-10-CM | POA: Insufficient documentation

## 2020-12-10 DIAGNOSIS — I10 Essential (primary) hypertension: Secondary | ICD-10-CM | POA: Diagnosis not present

## 2020-12-10 DIAGNOSIS — R1310 Dysphagia, unspecified: Secondary | ICD-10-CM

## 2020-12-10 HISTORY — PX: ESOPHAGOGASTRODUODENOSCOPY (EGD) WITH PROPOFOL: SHX5813

## 2020-12-10 SURGERY — ESOPHAGOGASTRODUODENOSCOPY (EGD) WITH PROPOFOL
Anesthesia: General | Site: Throat

## 2020-12-10 MED ORDER — LACTATED RINGERS IV SOLN: INTRAVENOUS | Status: DC

## 2020-12-10 MED ORDER — PROPOFOL 10 MG/ML IV BOLUS
INTRAVENOUS | Status: DC | PRN
  Administered 2020-12-10: 150 mg via INTRAVENOUS
  Administered 2020-12-10: 100 mg via INTRAVENOUS
  Administered 2020-12-10: 20 mg via INTRAVENOUS

## 2020-12-10 MED ORDER — SODIUM CHLORIDE 0.9 % IV SOLN: INTRAVENOUS | Status: DC

## 2020-12-10 MED ORDER — GLYCOPYRROLATE 0.2 MG/ML IJ SOLN
INTRAMUSCULAR | Status: DC | PRN
  Administered 2020-12-10: .1 mg via INTRAVENOUS

## 2020-12-10 SURGICAL SUPPLY — 11 items
BALLN DILATOR 15-18 8 (BALLOONS) ×2
BALLOON DILATOR 15-18 8 (BALLOONS) ×1 IMPLANT
BLOCK BITE 60FR ADLT L/F GRN (MISCELLANEOUS) ×2 IMPLANT
FORCEPS BIOP RAD 4 LRG CAP 4 (CUTTING FORCEPS) ×2 IMPLANT
GOWN CVR UNV OPN BCK APRN NK (MISCELLANEOUS) ×2 IMPLANT
GOWN ISOL THUMB LOOP REG UNIV (MISCELLANEOUS) ×4
KIT PRC NS LF DISP ENDO (KITS) ×1 IMPLANT
KIT PROCEDURE OLYMPUS (KITS) ×2
MANIFOLD NEPTUNE II (INSTRUMENTS) ×2 IMPLANT
SYR INFLATION 60ML (SYRINGE) ×2 IMPLANT
WATER STERILE IRR 250ML POUR (IV SOLUTION) ×2 IMPLANT

## 2020-12-10 NOTE — Anesthesia Preprocedure Evaluation (Signed)
Anesthesia Evaluation  Patient identified by MRN, date of birth, ID band Patient awake    Reviewed: Allergy & Precautions, NPO status , Patient's Chart, lab work & pertinent test results, reviewed documented beta blocker date and time   History of Anesthesia Complications Negative for: history of anesthetic complications  Airway Mallampati: III  TM Distance: >3 FB Neck ROM: Full    Dental no notable dental hx.    Pulmonary asthma ,    Pulmonary exam normal breath sounds clear to auscultation       Cardiovascular Exercise Tolerance: Good hypertension, (-) angina(-) DOE Normal cardiovascular exam Rhythm:Regular Rate:Normal   HLD   Neuro/Psych PSYCHIATRIC DISORDERS Depression negative neurological ROS     GI/Hepatic negative GI ROS, Neg liver ROS, neg GERD  ,  Endo/Other  negative endocrine ROS  Renal/GU Renal diseasenegative Renal ROS  negative genitourinary   Musculoskeletal  (+) Arthritis ,   Abdominal (+) + obese (BMI 35),   Peds negative pediatric ROS (+)  Hematology negative hematology ROS (+)   Anesthesia Other Findings   Reproductive/Obstetrics negative OB ROS                             Anesthesia Physical  Anesthesia Plan  ASA: 3  Anesthesia Plan: General   Post-op Pain Management:    Induction: Intravenous  PONV Risk Score and Plan: 3 and Propofol infusion, TIVA and Treatment may vary due to age or medical condition  Airway Management Planned: Natural Airway and Nasal Cannula  Additional Equipment:   Intra-op Plan:   Post-operative Plan: Extubation in OR  Informed Consent: I have reviewed the patients History and Physical, chart, labs and discussed the procedure including the risks, benefits and alternatives for the proposed anesthesia with the patient or authorized representative who has indicated his/her understanding and acceptance.     Dental advisory  given  Plan Discussed with: CRNA and Anesthesiologist  Anesthesia Plan Comments:         Anesthesia Quick Evaluation  Patient Active Problem List   Diagnosis Date Noted  . Prediabetes 04/28/2020  . Eczema 04/27/2020  . Recurrent major depressive disorder, in full remission ([place]) 04/27/2020  . S/P TAH (total abdominal hysterectomy) 04/26/2020  . Encounter for screening colonoscopy   . Polyp of colon   . History of renal calculi 12/24/2019  . Mild intermittent asthma without complication [id number]  . Constipation 12/24/2019  . Primary osteoarthritis of left knee 12/24/2019  . Mixed hyperlipidemia 12/24/2019  . BMI 35.0-35.9,adult 12/24/2019  . History of palpitations 12/24/2019    CBC Latest Ref Rng & Units 04/27/2020 12/24/2019 10/08/2018  WBC 3.4 - 10.8 x10E3/uL 5.7 6.6 7.6  Hemoglobin 11.1 - 15.9 g/dL 12.6 11.3 11.7(A)  Hematocrit 34.0 - 46.6 % 38.0 34.8 35(A)  Platelets 150 - 450 x10E3/uL 305 353 347   BMP Latest Ref Rng & Units 04/27/2020 12/24/2019 10/08/2018  Glucose 65 - 99 mg/dL 101(H) 97 -  BUN 6 - 24 mg/dL '9 11 11  '$ Creatinine 0.57 - 1.00 mg/dL 0.98 0.89 0.8  BUN/Creat Ratio 9 - '23 9 12 '$ -  Sodium 134 - 144 mmol/L 139 142 139  Potassium 3.5 - 5.2 mmol/L 4.2 4.6 3.8  Chloride 96 - 106 mmol/L 100 105 103  CO2 20 - 29 mmol/L '24 24 21  '$ Calcium 8.7 - 10.2 mg/dL 9.6 9.5 -    Risks and benefits of anesthesia discussed at length, patient or surrogate demonstrates  understanding. Appropriately NPO. Plan to proceed with anesthesia.  Champ Mungo, MD 12/10/20

## 2020-12-10 NOTE — Transfer of Care (Signed)
Immediate Anesthesia Transfer of Care Note  Patient: Kendra Ford - Martin  Procedure(s) Performed: ESOPHAGOGASTRODUODENOSCOPY (EGD) WITH PROPOFOL  Patient Location: PACU  Anesthesia Type: General  Level of Consciousness: awake, alert  and patient cooperative  Airway and Oxygen Therapy: Patient Spontanous Breathing and Patient connected to supplemental oxygen  Post-op Assessment: Post-op Vital signs reviewed, Patient's Cardiovascular Status Stable, Respiratory Function Stable, Patent Airway and No signs of Nausea or vomiting  Post-op Vital Signs: Reviewed and stable  Complications: No notable events documented.

## 2020-12-10 NOTE — Anesthesia Procedure Notes (Signed)
Date/Time: 12/10/2020 9:57 AM Performed by: Cameron Ali, CRNA Pre-anesthesia Checklist: Patient identified, Emergency Drugs available, Suction available, Timeout performed and Patient being monitored Patient Re-evaluated:Patient Re-evaluated prior to induction Oxygen Delivery Method: Nasal cannula Placement Confirmation: positive ETCO2

## 2020-12-10 NOTE — Interval H&P Note (Signed)
Lucilla Lame, MD [facility] [address] [place], [place] [address] Phone:[phone] Fax : [phone]  Primary Care Physician:  Glean Hess, MD Primary Gastroenterologist:  Dr. Allen Norris  Pre-Procedure History & Physical: HPI:  Kendra Ford is a 50 y.o. female is here for an endoscopy.   Past Medical History:  Diagnosis Date   Arthritis    left knee   Asthma    Chronic back pain    Hyperlipidemia    Hypertension    Recurrent kidney stones     Past Surgical History:  Procedure Laterality Date   ABDOMINAL HYSTERECTOMY  2018   fibroids/bleeding - ovaries remain   COLONOSCOPY WITH PROPOFOL N/A 01/24/2020   Procedure: COLONOSCOPY WITH PROPOFOL;  Surgeon: Lucilla Lame, MD;  Location: [place];  Service: Endoscopy;  Laterality: N/A;  priority 4   POLYPECTOMY  01/24/2020   Procedure: POLYPECTOMY;  Surgeon: Lucilla Lame, MD;  Location: [place];  Service: Endoscopy;;    Prior to Admission medications   Medication Sig Start Date End Date Taking? Authorizing Provider  EPINEPHrine 0.3 mg/0.3 mL IJ SOAJ injection See admin instructions. 09/25/20  Yes [provider]  mometasone (ELOCON) 0.1 % lotion SMARTSIG:In Ear(s) 08/31/20  Yes [provider]  omeprazole (PRILOSEC) 40 MG capsule Take 1 capsule by mouth daily. 08/31/20  Yes [provider]  phentermine (ADIPEX-P) 37.5 MG tablet Take 37.5 mg by mouth daily. 08/11/20  Yes [provider]  traMADol (ULTRAM) 50 MG tablet Take 2 tablets (100 mg total) by mouth every 6 (six) hours as needed. 10/16/20  Yes Margarette Canada, NP    Allergies as of 11/30/2020 - Review Complete 11/30/2020  Allergen Reaction Noted   Aspirin Hives 12/10/2019   Penicillins Hives 12/10/2019    Family History  Problem Relation Age of Onset   Hypertension Mother    Hyperlipidemia Mother    Liver disease Mother    Breast cancer Neg Hx     Social History   Socioeconomic History    Marital status: Married    Spouse name: Not on file   Number of children: Not on file   Years of education: Not on file   Highest education level: Not on file  Occupational History   Not on file  Tobacco Use   Smoking status: Never   Smokeless tobacco: Never  Vaping Use   Vaping Use: Never used  Substance and Sexual Activity   Alcohol use: Never   Drug use: Never   Sexual activity: Yes  Other Topics Concern   Not on file  Social History Narrative   Not on file   Social Determinants of Health   Financial Resource Strain: Not on file  Food Insecurity: Not on file  Transportation Needs: Not on file  Physical Activity: Not on file  Stress: Not on file  Social Connections: Not on file  Intimate Partner Violence: Not on file    Review of Systems: See HPI, otherwise negative ROS  Physical Exam: BP (!) 160/88   Pulse 66   Temp 98.1 F (36.7 C) (Temporal)   Wt 106.1 kg   SpO2 99%   BMI 34.56 kg/m  General:   Alert,  pleasant and cooperative in NAD Head:  Normocephalic and atraumatic. Neck:  Supple; no masses or thyromegaly. Lungs:  Clear throughout to auscultation.    Heart:  Regular rate and rhythm. Abdomen:  Soft, nontender and nondistended. Normal bowel sounds, without guarding, and without rebound.  Neurologic:  Alert and  oriented x4;  grossly normal neurologically.  Impression/Plan: Kendra Ford is here for an endoscopy to be performed for dypsphagia  Risks, benefits, limitations, and alternatives regarding  endoscopy have been reviewed with the patient.  Questions have been answered.  All parties agreeable.   Lucilla Lame, MD  12/10/2020, 9:49 AM

## 2020-12-10 NOTE — Op Note (Signed)
[facility] Gastroenterology Patient Name: Kendra Ford Procedure Date: 12/10/2020 9:52 AM MRN: [id number] Account #: [id number] Date of Birth: 1971/01/27 Admit Type: Outpatient Age: 50 Room: [facility] OR ROOM 01 Gender: Female Note Status: Finalized Procedure:             Upper GI endoscopy Indications:           Dysphagia Providers:             Lucilla Lame MD, MD Referring MD:          Halina Maidens, MD (Referring MD) Medicines:             Propofol per Anesthesia Complications:         No immediate complications. Procedure:             Pre-Anesthesia Assessment:                        - Prior to the procedure, a History and Physical was                         performed, and patient medications and allergies were                         reviewed. The patient's tolerance of previous                         anesthesia was also reviewed. The risks and benefits                         of the procedure and the sedation options and risks                         were discussed with the patient. All questions were                         answered, and informed consent was obtained. Prior                         Anticoagulants: The patient has taken no previous                         anticoagulant or antiplatelet agents. ASA Grade                         Assessment: II - A patient with mild systemic disease.                         After reviewing the risks and benefits, the patient                         was deemed in satisfactory condition to undergo the                         procedure.                        After obtaining informed consent, the endoscope was  passed under direct vision. Throughout the procedure,                         the patient's blood pressure, pulse, and oxygen                         saturations were monitored continuously. The was                         introduced through the mouth, and advanced to the                          second part of duodenum. The upper GI endoscopy was                         accomplished without difficulty. The patient tolerated                         the procedure well. Findings:      The examined esophagus was normal. Multiple biopsies were obtained in       the middle third of the esophagus with cold forceps for histology.      The stomach was normal.      The examined duodenum was normal.      A TTS dilator was passed through the scope. Dilation with a 15-16.5-18       mm balloon dilator was performed to 18 mm in the entire esophagus. Impression:            - Normal esophagus.                        - Normal stomach.                        - Normal examined duodenum.                        - Multiple biopsies were obtained in the middle third                         of the esophagus.                        - Dilation performed in the entire esophagus. Recommendation:        - Discharge patient to home.                        - Resume previous diet.                        - Continue present medications.                        - Await pathology results. Procedure Code(s):     --- Professional ---                        [phone], Esophagogastroduodenoscopy, flexible,                         transoral; with transendoscopic balloon dilation of  esophagus (less than 30 mm diameter)                        [id number], 59, Esophagogastroduodenoscopy, flexible,                         transoral; with biopsy, single or multiple Diagnosis Code(s):     --- Professional ---                        R13.10, Dysphagia, unspecified CPT copyright 2019 American Medical Association. All rights reserved. The codes documented in this report are preliminary and upon coder review may  be revised to meet current compliance requirements. Lucilla Lame MD, MD 12/10/2020 10:08:53 AM This report has been signed electronically. Number of Addenda: 0 Note Initiated On: 12/10/2020 9:52  AM Total Procedure Duration: 0 hours 5 minutes 4 seconds  Estimated Blood Loss:  Estimated blood loss: none.      [facility]

## 2020-12-10 NOTE — Anesthesia Postprocedure Evaluation (Signed)
Anesthesia Post Note  Patient: Kendra Ford - Martin  Procedure(s) Performed: ESOPHAGOGASTRODUODENOSCOPY (EGD) WITH PROPOFOL (Throat)     Patient location during evaluation: PACU Anesthesia Type: General Level of consciousness: awake and alert Pain management: pain level controlled Vital Signs Assessment: post-procedure vital signs reviewed and stable Respiratory status: spontaneous breathing, nonlabored ventilation, respiratory function stable and patient connected to nasal cannula oxygen Cardiovascular status: blood pressure returned to baseline and stable Postop Assessment: no apparent nausea or vomiting Anesthetic complications: no   No notable events documented.  Sinda Du

## 2020-12-11 ENCOUNTER — Encounter: Payer: Self-pay | Admitting: Gastroenterology

## 2020-12-11 LAB — SURGICAL PATHOLOGY

## 2021-01-14 ENCOUNTER — Other Ambulatory Visit: Payer: Self-pay

## 2021-01-14 ENCOUNTER — Ambulatory Visit
Admission: EM | Admit: 2021-01-14 | Discharge: 2021-01-14 | Disposition: A | Discharge status: Home / Self Care | Payer: No Typology Code available for payment source | Attending: Family Medicine | Admitting: Family Medicine

## 2021-01-14 DIAGNOSIS — N760 Acute vaginitis: Secondary | ICD-10-CM | POA: Insufficient documentation

## 2021-01-14 DIAGNOSIS — R102 Pelvic and perineal pain: Secondary | ICD-10-CM | POA: Diagnosis present

## 2021-01-14 DIAGNOSIS — B9689 Other specified bacterial agents as the cause of diseases classified elsewhere: Secondary | ICD-10-CM | POA: Insufficient documentation

## 2021-01-14 LAB — CHLAMYDIA/NGC RT PCR (ARMC ONLY)
Chlamydia Tr: NOT DETECTED
N gonorrhoeae: NOT DETECTED

## 2021-01-14 LAB — WET PREP, GENITAL
Sperm: NONE SEEN
Trich, Wet Prep: NONE SEEN
WBC, Wet Prep HPF POC: NONE SEEN
Yeast Wet Prep HPF POC: NONE SEEN

## 2021-01-14 MED ORDER — METRONIDAZOLE 500 MG PO TABS: 500.0000 mg | ORAL_TABLET | Freq: Two times a day (BID) | ORAL | 0 refills | Status: DC

## 2021-01-14 NOTE — ED Triage Notes (Signed)
Pt c/o vaginal discharge, odor for about 2 weeks. Pt is concerned about STIs. Pt also reports some pain to her lower abdomen.

## 2021-01-14 NOTE — Discharge Instructions (Signed)
STD testing should be back later today. Check mychart.  The odor is from [place]. Antibiotic as prescribed.  Take care  Dr. Lacinda Axon

## 2021-01-14 NOTE — ED Provider Notes (Signed)
[facility]    CSN: [id number] Arrival date & time: 01/14/21  0836      History   Chief Complaint Chief Complaint  Patient presents with   Vaginal Discharge    HPI 50 year old female presents with pelvic pain.  2-week history of intermittent pelvic pain.  Patient also reports vaginal odor.  No significant discharge.  She is sexually active.  She is concerned about the possibility of an STD.  No relieving factors.  No fever.  No other complaints or concerns at this time.  Past Medical History:  Diagnosis Date   Arthritis    left knee   Asthma    Chronic back pain    Hyperlipidemia    Hypertension    Recurrent kidney stones     Patient Active Problem List   Diagnosis Date Noted   Dysphagia    Prediabetes 04/28/2020   Eczema 04/27/2020   Recurrent major depressive disorder, in full remission ([place]) 04/27/2020   S/P TAH (total abdominal hysterectomy) 04/26/2020   Encounter for screening colonoscopy    Polyp of colon    History of renal calculi 12/24/2019   Mild intermittent asthma without complication [id number]   Constipation 12/24/2019   Primary osteoarthritis of left knee 12/24/2019   Mixed hyperlipidemia 12/24/2019   BMI 35.0-35.9,adult 12/24/2019   History of palpitations 12/24/2019    Past Surgical History:  Procedure Laterality Date   ABDOMINAL HYSTERECTOMY  2018   fibroids/bleeding - ovaries remain   COLONOSCOPY WITH PROPOFOL N/A 01/24/2020   Procedure: COLONOSCOPY WITH PROPOFOL;  Surgeon: Lucilla Lame, MD;  Location: [place];  Service: Endoscopy;  Laterality: N/A;  priority 4   ESOPHAGOGASTRODUODENOSCOPY (EGD) WITH PROPOFOL N/A 12/10/2020   Procedure: ESOPHAGOGASTRODUODENOSCOPY (EGD) WITH PROPOFOL;  Surgeon: Lucilla Lame, MD;  Location: [place];  Service: Endoscopy;  Laterality: N/A;   POLYPECTOMY  01/24/2020   Procedure: POLYPECTOMY;  Surgeon: Lucilla Lame, MD;  Location: [place];  Service: Endoscopy;;     OB History   No obstetric history on file.      Home Medications    Prior to Admission medications   Medication Sig Start Date End Date Taking? Authorizing Provider  EPINEPHrine 0.3 mg/0.3 mL IJ SOAJ injection See admin instructions. 09/25/20  Yes [provider]  metroNIDAZOLE (FLAGYL) 500 MG tablet Take 1 tablet (500 mg total) by mouth 2 (two) times daily. 01/14/21  Yes Jeana Kersting G, DO  mometasone (ELOCON) 0.1 % lotion SMARTSIG:In Ear(s) 08/31/20  Yes [provider]  omeprazole (PRILOSEC) 40 MG capsule Take 1 capsule by mouth daily. 08/31/20  Yes [provider]  phentermine (ADIPEX-P) 37.5 MG tablet Take 37.5 mg by mouth daily. 08/11/20  Yes [provider]  spironolactone (ALDACTONE) 100 MG tablet Take 100 mg by mouth daily. 12/15/20  Yes [provider]  traMADol (ULTRAM) 50 MG tablet Take 2 tablets (100 mg total) by mouth every 6 (six) hours as needed. 10/16/20  Yes Margarette Canada, NP    Family History Family History  Problem Relation Age of Onset   Hypertension Mother    Hyperlipidemia Mother    Liver disease Mother    Breast cancer Neg Hx     Social History Social History   Tobacco Use   Smoking status: Never   Smokeless tobacco: Never  Vaping Use   Vaping Use: Never used  Substance Use Topics   Alcohol use: Never   Drug use: Never     Allergies   Aspirin  and Penicillins   Review of Systems Review of Systems Per HPI  Physical Exam Triage Vital Signs ED Triage Vitals  Enc Vitals Group     BP 01/14/21 0916 (!) 156/88     Pulse Rate 01/14/21 0916 76     Resp 01/14/21 0916 18     Temp 01/14/21 0916 99.2 F (37.3 C)     Temp Source 01/14/21 0916 Oral     SpO2 01/14/21 0916 100 %     Weight 01/14/21 0915 240 lb (108.9 kg)     Height 01/14/21 0915 '5\' 9"'$  (1.753 m)     Head Circumference --      Peak Flow --      Pain Score 01/14/21 0914 8     Pain Loc --      Pain Edu? --      Excl. in [place]? --    Updated  Vital Signs BP (!) 156/88 (BP Location: Left Arm)   Pulse 76   Temp 99.2 F (37.3 C) (Oral)   Resp 18   Ht '5\' 9"'$  (1.753 m)   Wt 108.9 kg   SpO2 100%   BMI 35.44 kg/m   Visual Acuity Right Eye Distance:   Left Eye Distance:   Bilateral Distance:    Right Eye Near:   Left Eye Near:    Bilateral Near:     Physical Exam Vitals and nursing note reviewed.  Constitutional:      General: She is not in acute distress.    Appearance: Normal appearance. She is not ill-appearing.  HENT:     Head: Normocephalic and atraumatic.  Eyes:     General:        Right eye: No discharge.        Left eye: No discharge.     Conjunctiva/sclera: Conjunctivae normal.  Cardiovascular:     Rate and Rhythm: Normal rate and regular rhythm.  Pulmonary:     Effort: Pulmonary effort is normal.     Breath sounds: Normal breath sounds. No wheezing, rhonchi or rales.  Abdominal:     General: There is no distension.     Palpations: Abdomen is soft.     Comments: Mild tenderness in the lower abdomen.  Neurological:     Mental Status: She is alert.     UC Treatments / Results  Labs (all labs ordered are listed, but only abnormal results are displayed) Labs Reviewed  WET PREP, GENITAL - Abnormal; Notable for the following components:      Result Value   Clue Cells Wet Prep HPF POC PRESENT (*)    All other components within normal limits  CHLAMYDIA/NGC RT PCR [facility] ONLY)              EKG   Radiology No results found.  Procedures Procedures (including critical care time)  Medications Ordered in UC Medications - No data to display  Initial Impression / Assessment and Plan / UC Course  I have reviewed the triage vital signs and the nursing notes.  Pertinent labs & imaging results that were available during my care of the patient were reviewed by me and considered in my medical decision making (see chart for details).    50 year old female presents with pelvic pain and vaginal odor.  Wet  prep revealed bacterial vaginosis.  Treating with Flagyl.  Awaiting STD test results regarding her pelvic pain.  Supportive care.  Final Clinical Impressions(s) / UC Diagnoses   Final diagnoses:  Pelvic pain  BV (bacterial vaginosis)     Discharge Instructions      STD testing should be back later today. Check mychart.  The odor is from [place]. Antibiotic as prescribed.  Take care  Dr. Lacinda Axon    ED Prescriptions     Medication Sig Dispense Auth. Provider   metroNIDAZOLE (FLAGYL) 500 MG tablet Take 1 tablet (500 mg total) by mouth 2 (two) times daily. 14 tablet Coral Spikes, DO      PDMP not reviewed this encounter.   Coral Spikes, [place] 01/14/21 1112

## 2021-03-30 ENCOUNTER — Other Ambulatory Visit: Payer: Self-pay | Admitting: Internal Medicine

## 2021-03-30 DIAGNOSIS — L309 Dermatitis, unspecified: Secondary | ICD-10-CM

## 2021-03-31 NOTE — Telephone Encounter (Signed)
Requested medications are due for refill today.  unsure  Requested medications are on the active medications list.  yes  Last refill. 09/03/2020  Future visit scheduled.   yes  Notes to clinic.  Medication listed as historical medication. Medication D/c'd by Margarette Canada on 10/16/2020.

## 2021-04-27 ENCOUNTER — Other Ambulatory Visit: Payer: Self-pay | Admitting: Internal Medicine

## 2021-04-28 ENCOUNTER — Encounter: Payer: No Typology Code available for payment source | Admitting: Internal Medicine

## 2021-04-28 NOTE — Progress Notes (Deleted)
Date:  04/28/2021   Name:  Kendra Ford   DOB:  [birth year]   MRN:  [id number]   Chief Complaint: No chief complaint on file. Kendra Ford is a 50 y.o. female who presents today for her Complete Annual Exam. She feels {DESC; WELL/FAIRLY WELL/POORLY:18703}. She reports exercising ***. She reports she is sleeping {DESC; WELL/FAIRLY WELL/POORLY:18703}. Breast complaints ***.  Mammogram: 01/2020 DEXA: none Pap smear: discontinued Colonoscopy: 01/2020  Immunization History  Administered Date(s) Administered   PFIZER(Purple Top)SARS-COV-2 Vaccination 12/01/2019, 12/20/2019    HPI  Lab Results  Component Value Date   NA 139 04/27/2020   K 4.2 04/27/2020   CO2 24 04/27/2020   GLUCOSE 101 (H) 04/27/2020   BUN 9 04/27/2020   CREATININE 0.98 04/27/2020   CALCIUM 9.6 04/27/2020   GFRNONAA 67 04/27/2020   Lab Results  Component Value Date   CHOL 186 04/27/2020   HDL 65 04/27/2020   LDLCALC 97 04/27/2020   TRIG 140 04/27/2020   CHOLHDL 2.9 04/27/2020   Lab Results  Component Value Date   TSH 3.310 04/27/2020   Lab Results  Component Value Date   HGBA1C 6.0 (H) 04/27/2020   Lab Results  Component Value Date   WBC 5.7 04/27/2020   HGB 12.6 04/27/2020   HCT 38.0 04/27/2020   MCV 84 04/27/2020   PLT 305 04/27/2020   Lab Results  Component Value Date   ALT 7 04/27/2020   AST 16 04/27/2020   ALKPHOS 86 04/27/2020   BILITOT 0.2 04/27/2020   No results found for: 25OHVITD2, 25OHVITD3, VD25OH   Review of Systems  Constitutional:  Negative for chills, fatigue and fever.  HENT:  Negative for congestion, hearing loss, tinnitus, trouble swallowing and voice change.   Eyes:  Negative for visual disturbance.  Respiratory:  Negative for cough, chest tightness, shortness of breath and wheezing.   Cardiovascular:  Negative for chest pain, palpitations and leg swelling.  Gastrointestinal:  Negative for abdominal pain, constipation, diarrhea and vomiting.   Endocrine: Negative for polydipsia and polyuria.  Genitourinary:  Negative for dysuria, frequency, genital sores, vaginal bleeding and vaginal discharge.  Musculoskeletal:  Negative for arthralgias, gait problem and joint swelling.  Skin:  Negative for color change and rash.  Neurological:  Negative for dizziness, tremors, light-headedness and headaches.  Hematological:  Negative for adenopathy. Does not bruise/bleed easily.  Psychiatric/Behavioral:  Negative for dysphoric mood and sleep disturbance. The patient is not nervous/anxious.    Patient Active Problem List   Diagnosis Date Noted   Dysphagia    Prediabetes 04/28/2020   Eczema 04/27/2020   Recurrent major depressive disorder, in full remission ([place]) 04/27/2020   S/P TAH (total abdominal hysterectomy) 04/26/2020   Encounter for screening colonoscopy    Polyp of colon    History of renal calculi 12/24/2019   Mild intermittent asthma without complication 35/32/9924   Constipation 12/24/2019   Primary osteoarthritis of left knee 12/24/2019   Mixed hyperlipidemia 12/24/2019   BMI 35.0-35.9,adult 12/24/2019   History of palpitations 12/24/2019    Allergies  Allergen Reactions   Aspirin Hives   Penicillins Hives    Past Surgical History:  Procedure Laterality Date   ABDOMINAL HYSTERECTOMY  2018   fibroids/bleeding - ovaries remain   COLONOSCOPY WITH PROPOFOL N/A 01/24/2020   Procedure: COLONOSCOPY WITH PROPOFOL;  Surgeon: Lucilla Lame, MD;  Location: [place];  Service: Endoscopy;  Laterality: N/A;  priority 4   ESOPHAGOGASTRODUODENOSCOPY (EGD) WITH PROPOFOL N/A 12/10/2020  Procedure: ESOPHAGOGASTRODUODENOSCOPY (EGD) WITH PROPOFOL;  Surgeon: Lucilla Lame, MD;  Location: [place];  Service: Endoscopy;  Laterality: N/A;   POLYPECTOMY  01/24/2020   Procedure: POLYPECTOMY;  Surgeon: Lucilla Lame, MD;  Location: [facility];  Service: Endoscopy;;    Social History   Tobacco Use   Smoking  status: Never   Smokeless tobacco: Never  Vaping Use   Vaping Use: Never used  Substance Use Topics   Alcohol use: Never   Drug use: Never     Medication list has been reviewed and updated.  No outpatient medications have been marked as taking for the 04/28/21 encounter (Appointment) with Glean Hess, MD.    [facility] 2/9 Scores 10/23/2020 04/27/2020 12/24/2019  PHQ - 2 Score 0 0 0  PHQ- 9 Score 0 0 6    GAD 7 : Generalized Anxiety Score 10/23/2020 04/27/2020 12/24/2019  Nervous, Anxious, on Edge 0 1 0  Control/stop worrying 0 1 0  Worry too much - different things 0 1 0  Trouble relaxing 0 0 0  Restless 0 0 0  Easily annoyed or irritable 0 1 0  Afraid - awful might happen 0 1 0  Total GAD 7 Score 0 5 0  Anxiety Difficulty - - Not difficult at all    BP Readings from Last 3 Encounters:  01/14/21 (!) 156/88  12/10/20 (!) 153/99  11/30/20 (!) 142/75    Physical Exam Vitals and nursing note reviewed.  Constitutional:      General: She is not in acute distress.    Appearance: She is well-developed.  HENT:     Head: Normocephalic and atraumatic.     Right Ear: Tympanic membrane and ear canal normal.     Left Ear: Tympanic membrane and ear canal normal.     Nose:     Right Sinus: No maxillary sinus tenderness.     Left Sinus: No maxillary sinus tenderness.  Eyes:     General: No scleral icterus.       Right eye: No discharge.        Left eye: No discharge.     Conjunctiva/sclera: Conjunctivae normal.  Neck:     Thyroid: No thyromegaly.     Vascular: No carotid bruit.  Cardiovascular:     Rate and Rhythm: Normal rate and regular rhythm.     Pulses: Normal pulses.     Heart sounds: Normal heart sounds.  Pulmonary:     Effort: Pulmonary effort is normal. No respiratory distress.     Breath sounds: No wheezing.  Chest:  Breasts:    Right: No mass, nipple discharge, skin change or tenderness.     Left: No mass, nipple discharge, skin change or tenderness.   Abdominal:     General: Bowel sounds are normal.     Palpations: Abdomen is soft.     Tenderness: There is no abdominal tenderness.  Musculoskeletal:     Cervical back: Normal range of motion. No erythema.     Right lower leg: No edema.     Left lower leg: No edema.  Lymphadenopathy:     Cervical: No cervical adenopathy.  Skin:    General: Skin is warm and dry.     Findings: No rash.  Neurological:     Mental Status: She is alert and oriented to person, place, and time.     Cranial Nerves: No cranial nerve deficit.     Sensory: No sensory deficit.     Deep Tendon  Reflexes: Reflexes are normal and symmetric.  Psychiatric:        Attention and Perception: Attention normal.        Mood and Affect: Mood normal.    Wt Readings from Last 3 Encounters:  01/14/21 240 lb (108.9 kg)  12/10/20 234 lb (106.1 kg)  11/30/20 234 lb 9.6 oz (106.4 kg)    There were no vitals taken for this visit.  Assessment and Plan:

## 2021-06-24 ENCOUNTER — Ambulatory Visit (INDEPENDENT_AMBULATORY_CARE_PROVIDER_SITE_OTHER): Payer: 59 | Admitting: Internal Medicine

## 2021-06-24 ENCOUNTER — Encounter: Payer: Self-pay | Admitting: Internal Medicine

## 2021-06-24 ENCOUNTER — Other Ambulatory Visit: Payer: Self-pay

## 2021-06-24 VITALS — BP 124/88 | HR 83 | Ht 69.0 in | Wt 236.0 lb

## 2021-06-24 DIAGNOSIS — R3129 Other microscopic hematuria: Secondary | ICD-10-CM

## 2021-06-24 DIAGNOSIS — F3342 Major depressive disorder, recurrent, in full remission: Secondary | ICD-10-CM

## 2021-06-24 DIAGNOSIS — K5900 Constipation, unspecified: Secondary | ICD-10-CM

## 2021-06-24 DIAGNOSIS — R102 Pelvic and perineal pain: Secondary | ICD-10-CM

## 2021-06-24 LAB — POCT URINALYSIS DIPSTICK
Bilirubin, UA: NEGATIVE
Glucose, UA: NEGATIVE
Ketones, UA: 5
Leukocytes, UA: NEGATIVE
Nitrite, UA: NEGATIVE
Protein, UA: NEGATIVE
Spec Grav, UA: 1.015 (ref 1.010–1.025)
Urobilinogen, UA: 0.2 E.U./dL
pH, UA: 6 (ref 5.0–8.0)

## 2021-06-24 MED ORDER — TRULANCE 3 MG PO TABS: 1.0000 | ORAL_TABLET | Freq: Every day | ORAL | 0 refills | Status: DC

## 2021-06-24 NOTE — Progress Notes (Signed)
Date:  06/24/2021   Name:  Kendra Ford   DOB:  1971/02/16   MRN:  [id number]   Chief Complaint: Pelvic Pain  Pelvic Pain The patient's primary symptoms include pelvic pain. This is a new problem. Episode onset: come and goes X2-3 weeks. The problem occurs constantly. The problem has been gradually worsening. The pain is mild. The problem affects the right side. She is not pregnant. Associated symptoms include abdominal pain, back pain, constipation and flank pain. Treatments tried: miralax, metamucil, liquid form. The treatment provided mild relief. She is sexually active. It is unknown whether or not her partner has an STD. She uses hysterectomy for contraception.   Lab Results  Component Value Date   NA 139 04/27/2020   K 4.2 04/27/2020   CO2 24 04/27/2020   GLUCOSE 101 (H) 04/27/2020   BUN 9 04/27/2020   CREATININE 0.98 04/27/2020   CALCIUM 9.6 04/27/2020   GFRNONAA 67 04/27/2020   Lab Results  Component Value Date   CHOL 186 04/27/2020   HDL 65 04/27/2020   LDLCALC 97 04/27/2020   TRIG 140 04/27/2020   CHOLHDL 2.9 04/27/2020   Lab Results  Component Value Date   TSH 3.310 04/27/2020   Lab Results  Component Value Date   HGBA1C 6.0 (H) 04/27/2020   Lab Results  Component Value Date   WBC 5.7 04/27/2020   HGB 12.6 04/27/2020   HCT 38.0 04/27/2020   MCV 84 04/27/2020   PLT 305 04/27/2020   Lab Results  Component Value Date   ALT 7 04/27/2020   AST 16 04/27/2020   ALKPHOS 86 04/27/2020   BILITOT 0.2 04/27/2020   No results found for: 25OHVITD2, 25OHVITD3, VD25OH   Review of Systems  Gastrointestinal:  Positive for abdominal pain and constipation.  Genitourinary:  Positive for flank pain and pelvic pain.  Musculoskeletal:  Positive for back pain.   Patient Active Problem List   Diagnosis Date Noted   Dysphagia    Prediabetes 04/28/2020   Eczema 04/27/2020   Recurrent major depressive disorder, in full remission ([place]) 04/27/2020   S/P TAH  (total abdominal hysterectomy) 04/26/2020   Encounter for screening colonoscopy    Polyp of colon    History of renal calculi 12/24/2019   Mild intermittent asthma without complication 25/85/2778   Constipation 12/24/2019   Primary osteoarthritis of left knee 12/24/2019   Mixed hyperlipidemia 12/24/2019   BMI 35.0-35.9,adult 12/24/2019   History of palpitations 12/24/2019    Allergies  Allergen Reactions   Aspirin Hives   Penicillins Hives    Past Surgical History:  Procedure Laterality Date   ABDOMINAL HYSTERECTOMY  2018   fibroids/bleeding - ovaries remain   COLONOSCOPY WITH PROPOFOL N/A 01/24/2020   Procedure: COLONOSCOPY WITH PROPOFOL;  Surgeon: Lucilla Lame, MD;  Location: [place];  Service: Endoscopy;  Laterality: N/A;  priority 4   ESOPHAGOGASTRODUODENOSCOPY (EGD) WITH PROPOFOL N/A 12/10/2020   Procedure: ESOPHAGOGASTRODUODENOSCOPY (EGD) WITH PROPOFOL;  Surgeon: Lucilla Lame, MD;  Location: [place];  Service: Endoscopy;  Laterality: N/A;   POLYPECTOMY  01/24/2020   Procedure: POLYPECTOMY;  Surgeon: Lucilla Lame, MD;  Location: [facility];  Service: Endoscopy;;    Social History   Tobacco Use   Smoking status: Never   Smokeless tobacco: Never  Vaping Use   Vaping Use: Never used  Substance Use Topics   Alcohol use: Never   Drug use: Never     Medication list has been reviewed and updated.  No outpatient medications have been marked as taking for the 06/24/21 encounter (Office Visit) with Glean Hess, MD.    [facility] 2/9 Scores 10/23/2020 04/27/2020 12/24/2019  PHQ - 2 Score 0 0 0  PHQ- 9 Score 0 0 6    GAD 7 : Generalized Anxiety Score 10/23/2020 04/27/2020 12/24/2019  Nervous, Anxious, on Edge 0 1 0  Control/stop worrying 0 1 0  Worry too much - different things 0 1 0  Trouble relaxing 0 0 0  Restless 0 0 0  Easily annoyed or irritable 0 1 0  Afraid - awful might happen 0 1 0  Total GAD 7 Score 0 5 0  Anxiety Difficulty -  - Not difficult at all    BP Readings from Last 3 Encounters:  01/14/21 (!) 156/88  12/10/20 (!) 153/99  11/30/20 (!) 142/75    Physical Exam  Wt Readings from Last 3 Encounters:  01/14/21 240 lb (108.9 kg)  12/10/20 234 lb (106.1 kg)  11/30/20 234 lb 9.6 oz (106.4 kg)    Ht 5\' 9"  (1.753 m)    BMI 35.44 kg/m   Assessment and Plan:

## 2021-06-24 NOTE — Progress Notes (Signed)
Date:  06/24/2021   Name:  Kendra Ford   DOB:  1970/10/15   MRN:  [id number]   Chief Complaint: Pelvic Pain  Constipation This is a new problem. The current episode started 1 to 4 weeks ago. The problem is unchanged. The stool is described as formed. The patient is not on a high fiber diet. There has Not been adequate water intake. Pertinent negatives include no fever, nausea or vomiting. Risk factors include obesity. She has tried laxatives (miralax and colon cleanses) for the symptoms. The treatment provided moderate relief.  Pelvic Pain The patient's primary symptoms include pelvic pain. This is a chronic problem. Episode onset: since 2018 after hysterectomy. The problem has been unchanged. The pain is mild. The problem affects the right side. She is not pregnant. Associated symptoms include constipation. Pertinent negatives include no chills, dysuria, fever, hematuria, nausea, urgency or vomiting.   Lab Results  Component Value Date   NA 139 04/27/2020   K 4.2 04/27/2020   CO2 24 04/27/2020   GLUCOSE 101 (H) 04/27/2020   BUN 9 04/27/2020   CREATININE 0.98 04/27/2020   CALCIUM 9.6 04/27/2020   GFRNONAA 67 04/27/2020   Lab Results  Component Value Date   CHOL 186 04/27/2020   HDL 65 04/27/2020   LDLCALC 97 04/27/2020   TRIG 140 04/27/2020   CHOLHDL 2.9 04/27/2020   Lab Results  Component Value Date   TSH 3.310 04/27/2020   Lab Results  Component Value Date   HGBA1C 6.0 (H) 04/27/2020   Lab Results  Component Value Date   WBC 5.7 04/27/2020   HGB 12.6 04/27/2020   HCT 38.0 04/27/2020   MCV 84 04/27/2020   PLT 305 04/27/2020   Lab Results  Component Value Date   ALT 7 04/27/2020   AST 16 04/27/2020   ALKPHOS 86 04/27/2020   BILITOT 0.2 04/27/2020   No results found for: 25OHVITD2, 25OHVITD3, VD25OH   Review of Systems  Constitutional:  Negative for chills, fatigue and fever.  Respiratory:  Negative for chest tightness and shortness of breath.    Cardiovascular:  Negative for chest pain and leg swelling.  Gastrointestinal:  Positive for constipation. Negative for blood in stool, nausea and vomiting.  Genitourinary:  Positive for pelvic pain. Negative for dysuria, hematuria and urgency.  Psychiatric/Behavioral:  Negative for dysphoric mood and sleep disturbance. The patient is not nervous/anxious.    Patient Active Problem List   Diagnosis Date Noted   Dysphagia    Prediabetes 04/28/2020   Eczema 04/27/2020   Recurrent major depressive disorder, in full remission ([place]) 04/27/2020   S/P TAH (total abdominal hysterectomy) 04/26/2020   Encounter for screening colonoscopy    Polyp of colon    History of renal calculi 12/24/2019   Mild intermittent asthma without complication 38/25/0539   Constipation 12/24/2019   Primary osteoarthritis of left knee 12/24/2019   Mixed hyperlipidemia 12/24/2019   BMI 35.0-35.9,adult 12/24/2019   History of palpitations 12/24/2019    Allergies  Allergen Reactions   Aspirin Hives   Penicillins Hives    Past Surgical History:  Procedure Laterality Date   ABDOMINAL HYSTERECTOMY  2018   fibroids/bleeding - ovaries remain   COLONOSCOPY WITH PROPOFOL N/A 01/24/2020   Procedure: COLONOSCOPY WITH PROPOFOL;  Surgeon: Lucilla Lame, MD;  Location: [place];  Service: Endoscopy;  Laterality: N/A;  priority 4   ESOPHAGOGASTRODUODENOSCOPY (EGD) WITH PROPOFOL N/A 12/10/2020   Procedure: ESOPHAGOGASTRODUODENOSCOPY (EGD) WITH PROPOFOL;  Surgeon: Lucilla Lame, MD;  Location: [place];  Service: Endoscopy;  Laterality: N/A;   POLYPECTOMY  01/24/2020   Procedure: POLYPECTOMY;  Surgeon: Lucilla Lame, MD;  Location: [facility];  Service: Endoscopy;;    Social History   Tobacco Use   Smoking status: Never   Smokeless tobacco: Never  Vaping Use   Vaping Use: Never used  Substance Use Topics   Alcohol use: Never   Drug use: Never     Medication list has been reviewed and  updated.  Current Meds  Medication Sig   EPINEPHrine 0.3 mg/0.3 mL IJ SOAJ injection See admin instructions.   mometasone (ELOCON) 0.1 % ointment APPLY TO AFFECTED AREA TOPICALLY EVERY DAY   omeprazole (PRILOSEC) 40 MG capsule Take 1 capsule by mouth daily.   spironolactone (ALDACTONE) 100 MG tablet Take 100 mg by mouth daily.   traMADol (ULTRAM) 50 MG tablet Take 2 tablets (100 mg total) by mouth every 6 (six) hours as needed.    PHQ 2/9 Scores 06/24/2021 10/23/2020 04/27/2020 12/24/2019  PHQ - 2 Score 2 0 0 0  PHQ- 9 Score 5 0 0 6    GAD 7 : Generalized Anxiety Score 06/24/2021 10/23/2020 04/27/2020 12/24/2019  Nervous, Anxious, on Edge 1 0 1 0  Control/stop worrying 1 0 1 0  Worry too much - different things 0 0 1 0  Trouble relaxing 0 0 0 0  Restless 0 0 0 0  Easily annoyed or irritable 1 0 1 0  Afraid - awful might happen 1 0 1 0  Total GAD 7 Score 4 0 5 0  Anxiety Difficulty - - - Not difficult at all    BP Readings from Last 3 Encounters:  06/24/21 124/88  01/14/21 (!) 156/88  12/10/20 (!) 153/99    Physical Exam Vitals and nursing note reviewed.  Constitutional:      General: She is not in acute distress.    Appearance: Normal appearance. She is well-developed.  HENT:     Head: Normocephalic and atraumatic.  Cardiovascular:     Rate and Rhythm: Normal rate and regular rhythm.  Pulmonary:     Effort: Pulmonary effort is normal. No respiratory distress.     Breath sounds: No wheezing or rhonchi.  Abdominal:     General: Bowel sounds are normal.     Palpations: Abdomen is soft.     Tenderness: There is abdominal tenderness in the right lower quadrant. There is left CVA tenderness. There is no right CVA tenderness, guarding or rebound.  Skin:    General: Skin is warm and dry.     Findings: No rash.  Neurological:     Mental Status: She is alert and oriented to person, place, and time.  Psychiatric:        Mood and Affect: Mood normal.        Behavior: Behavior  normal.    Wt Readings from Last 3 Encounters:  06/24/21 236 lb (107 kg)  01/14/21 240 lb (108.9 kg)  12/10/20 234 lb (106.1 kg)    BP 124/88    Pulse 83    Ht 5\' 9"  (1.753 m)    Wt 236 lb (107 kg)    SpO2 96%    BMI 34.85 kg/m   Assessment and Plan: 1. Constipation, unspecified constipation type Chronic lifelong sx - worse recently. Increase fluid intake to 80 oz daily. Begin Trulance daily. - Plecanatide (TRULANCE) 3 MG TABS; Take 1 tablet by mouth daily at 6 (six) AM.  Dispense:  90 tablet; Refill: 0  2. Other microscopic hematuria With hx of renal stones - her left flank pain may be a stone passing Continue to monitor, increase fluids  3. Pelvic pain in female Long standing persistent - will get [place] to rule out ovarian pathology - US Pelvis Complete - POCT urinalysis dipstick  4. Recurrent major depressive disorder, in full remission ([place]) Mild symptoms not requiring medications at this time.   Partially dictated using Editor, commissioning. Any errors are unintentional.  Halina Maidens, MD [facility]  06/24/2021

## 2021-06-24 NOTE — Patient Instructions (Signed)
Take Trulance daily with water.  Increase daily fluid intake to 80 oz minimum.

## 2021-06-29 ENCOUNTER — Other Ambulatory Visit: Payer: Self-pay | Admitting: Internal Medicine

## 2021-06-29 ENCOUNTER — Telehealth: Payer: Self-pay

## 2021-06-29 DIAGNOSIS — R102 Pelvic and perineal pain: Secondary | ICD-10-CM

## 2021-06-29 DIAGNOSIS — G8929 Other chronic pain: Secondary | ICD-10-CM

## 2021-06-29 NOTE — Telephone Encounter (Signed)
New order was placed with transvaginal from Dr. Army Melia.

## 2021-06-29 NOTE — Telephone Encounter (Signed)
Copied from [place] [id number]. Topic: General - Other >> Jun 29, 2021 12:59 PM Parke Poisson wrote: Reason for CRM: [facility] would like to know if there is a reason transvaginal ultrasound was not ordered. Per Verdis Frederickson test is usually pelvic complete with transvaginal

## 2021-07-02 ENCOUNTER — Telehealth: Payer: Self-pay

## 2021-07-02 NOTE — Telephone Encounter (Signed)
Copied from [place] [id number]. Topic: General - Other ?>> Jul 02, 2021 12:22 PM Alanda Slim E wrote: ?Reason for CRM: Pt stated someone she spoke with on Tuesday was suppose to call her back with some covid related question to ask her before having her sonogram / please advise/ pt stated she was never called back ?

## 2021-07-02 NOTE — Telephone Encounter (Signed)
Left pt VM informing Ultrasound is scheduled for March 8th at 10:15 AM at [facility]. Told her to come with a FULL BLADDER to her scheduled appt.  ? ?PEC may inform if patient returns call. CRM created. ?

## 2021-07-07 ENCOUNTER — Telehealth: Payer: Self-pay

## 2021-07-07 ENCOUNTER — Other Ambulatory Visit: Payer: Self-pay | Admitting: Internal Medicine

## 2021-07-07 ENCOUNTER — Other Ambulatory Visit: Payer: Self-pay

## 2021-07-07 ENCOUNTER — Ambulatory Visit
Admission: RE | Admit: 2021-07-07 | Discharge: 2021-07-07 | Disposition: A | Discharge status: Home / Self Care | Payer: 59 | Source: Ambulatory Visit | Attending: Internal Medicine | Admitting: Internal Medicine

## 2021-07-07 DIAGNOSIS — R102 Pelvic and perineal pain: Secondary | ICD-10-CM | POA: Diagnosis not present

## 2021-07-07 DIAGNOSIS — G8929 Other chronic pain: Secondary | ICD-10-CM | POA: Diagnosis present

## 2021-07-07 DIAGNOSIS — N838 Other noninflammatory disorders of ovary, fallopian tube and broad ligament: Secondary | ICD-10-CM

## 2021-07-07 NOTE — Telephone Encounter (Signed)
Please review.  KP

## 2021-07-07 NOTE — Telephone Encounter (Signed)
Ruby with [facility] calling to report Pelvic Ultrasound is ready in Epic.Kendra Ford in the practice notified. ?

## 2021-07-09 NOTE — Progress Notes (Signed)
Pt has MRI scheduled for 07/19/2021 @ 6:00 PM.

## 2021-07-19 ENCOUNTER — Other Ambulatory Visit: Payer: Self-pay

## 2021-07-19 ENCOUNTER — Ambulatory Visit
Admission: RE | Admit: 2021-07-19 | Discharge: 2021-07-19 | Disposition: A | Discharge status: Home / Self Care | Payer: 59 | Source: Ambulatory Visit | Attending: Internal Medicine | Admitting: Internal Medicine

## 2021-07-19 DIAGNOSIS — N838 Other noninflammatory disorders of ovary, fallopian tube and broad ligament: Secondary | ICD-10-CM | POA: Insufficient documentation

## 2021-07-19 MED ORDER — GADOBUTROL 1 MMOL/ML IV SOLN
10.0000 mL | Freq: Once | INTRAVENOUS | Status: AC | PRN
  Administered 2021-07-19: 10 mL via INTRAVENOUS

## 2021-07-23 ENCOUNTER — Encounter: Payer: Self-pay | Admitting: Internal Medicine

## 2021-07-26 ENCOUNTER — Encounter: Payer: Self-pay | Admitting: Internal Medicine

## 2021-07-26 ENCOUNTER — Other Ambulatory Visit: Payer: Self-pay

## 2021-07-26 ENCOUNTER — Ambulatory Visit (INDEPENDENT_AMBULATORY_CARE_PROVIDER_SITE_OTHER): Payer: 59 | Admitting: Internal Medicine

## 2021-07-26 VITALS — BP 124/68 | HR 69 | Ht 69.0 in | Wt 236.0 lb

## 2021-07-26 DIAGNOSIS — Z1231 Encounter for screening mammogram for malignant neoplasm of breast: Secondary | ICD-10-CM | POA: Diagnosis not present

## 2021-07-26 DIAGNOSIS — R1310 Dysphagia, unspecified: Secondary | ICD-10-CM

## 2021-07-26 DIAGNOSIS — E782 Mixed hyperlipidemia: Secondary | ICD-10-CM | POA: Diagnosis not present

## 2021-07-26 DIAGNOSIS — Z114 Encounter for screening for human immunodeficiency virus [HIV]: Secondary | ICD-10-CM

## 2021-07-26 DIAGNOSIS — J4520 Mild intermittent asthma, uncomplicated: Secondary | ICD-10-CM | POA: Diagnosis not present

## 2021-07-26 DIAGNOSIS — K5900 Constipation, unspecified: Secondary | ICD-10-CM | POA: Diagnosis not present

## 2021-07-26 DIAGNOSIS — N838 Other noninflammatory disorders of ovary, fallopian tube and broad ligament: Secondary | ICD-10-CM | POA: Diagnosis not present

## 2021-07-26 DIAGNOSIS — Z111 Encounter for screening for respiratory tuberculosis: Secondary | ICD-10-CM

## 2021-07-26 DIAGNOSIS — Z0000 Encounter for general adult medical examination without abnormal findings: Secondary | ICD-10-CM | POA: Diagnosis not present

## 2021-07-26 DIAGNOSIS — R7303 Prediabetes: Secondary | ICD-10-CM | POA: Diagnosis not present

## 2021-07-26 NOTE — Progress Notes (Signed)
? ? ?Date:  07/26/2021  ? ?Name:  Kendra Ford   DOB:  12/02/1970   MRN:  [id number] ? ? ?Chief Complaint: Annual Exam ?Kendra Ford is a 51 y.o. female who presents today for her Complete Annual Exam. She feels well. She reports exercising- none at this time. She reports she is sleeping poorly. Breast complaints - none.  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 ? ?Mammogram: 01/2020 ?DEXA: none ?Pap smear: discontinued ?Colonoscopy: 01/2020 repeat 7 yrs ? ?Health Maintenance Due  ?Topic Date Due  ? HIV Screening  Never done  ? TETANUS/TDAP  Never done  ? MAMMOGRAM  02/04/2021  ?  ?Immunization History  ?Administered Date(s) Administered  ? PFIZER(Purple Top)SARS-COV-2 Vaccination 12/01/2019, 12/20/2019  ? ? ?Gastroesophageal Reflux ?She complains of heartburn. She reports no abdominal pain, no chest pain, no coughing or no wheezing. This is a recurrent problem. The problem occurs rarely. Pertinent negatives include no fatigue. She has tried a PPI (as needed) for the symptoms.  ?Asthma ?There is no cough, shortness of breath or wheezing. The problem occurs intermittently. Associated symptoms include heartburn. Pertinent negatives include no chest pain, fever, headaches or trouble swallowing. Her symptoms are alleviated by beta-agonist. Her past medical history is significant for asthma.  ?Constipation ?This is a chronic problem. The problem has been gradually improving since onset. Pertinent negatives include no abdominal pain, diarrhea, fever or vomiting. Treatments tried: trulance every other day.  ?Ovarian mass - seen on [place] then MRI.  Appears benign and recommendations to follow via [place]. ? ?Lab Results  ?Component Value Date  ? NA 139 04/27/2020  ? K 4.2 04/27/2020  ? CO2 24 04/27/2020  ? GLUCOSE 101 (H) 04/27/2020  ? BUN 9 04/27/2020  ? CREATININE 0.98 04/27/2020  ? CALCIUM 9.6 04/27/2020  ? GFRNONAA 67 04/27/2020  ? ?Lab Results  ?Component Value Date  ? CHOL 186 04/27/2020  ?  HDL 65 04/27/2020  ? [place] 97 04/27/2020  ? TRIG 140 04/27/2020  ? CHOLHDL 2.9 04/27/2020  ? ?Lab Results  ?Component Value Date  ? TSH 3.310 04/27/2020  ? ?Lab Results  ?Component Value Date  ? HGBA1C 6.0 (H) 04/27/2020  ? ?Lab Results  ?Component Value Date  ? WBC 5.7 04/27/2020  ? HGB 12.6 04/27/2020  ? HCT 38.0 04/27/2020  ? MCV 84 04/27/2020  ? PLT 305 04/27/2020  ? ?Lab Results  ?Component Value Date  ? ALT 7 04/27/2020  ? AST 16 04/27/2020  ? ALKPHOS 86 04/27/2020  ? BILITOT 0.2 04/27/2020  ? ?No results found for: 25OHVITD2, [place], VD25OH  ? ?Review of Systems  ?Constitutional:  Negative for chills, fatigue and fever.  ?HENT:  Negative for congestion, hearing loss, tinnitus, trouble swallowing and voice change.   ?Eyes:  Negative for visual disturbance.  ?Respiratory:  Negative for cough, chest tightness, shortness of breath and wheezing.   ?Cardiovascular:  Negative for chest pain, palpitations and leg swelling.  ?Gastrointestinal:  Positive for constipation and heartburn. Negative for abdominal pain, diarrhea and vomiting.  ?Endocrine: Negative for polydipsia and polyuria.  ?Genitourinary:  Negative for dysuria, frequency, genital sores, vaginal bleeding and vaginal discharge.  ?Musculoskeletal:  Negative for arthralgias, gait problem and joint swelling.  ?Skin:  Negative for color change and rash.  ?Neurological:  Negative for dizziness, tremors, light-headedness and headaches.  ?Hematological:  Negative for adenopathy. Does not bruise/bleed easily.  ?Psychiatric/Behavioral:  Negative for dysphoric mood and sleep disturbance. The  patient is not nervous/anxious.   ? ?Patient Active Problem List  ? Diagnosis Date Noted  ? Dysphagia   ? Prediabetes 04/28/2020  ? Eczema 04/27/2020  ? Recurrent major depressive disorder, in full remission ([place]) 04/27/2020  ? S/P TAH (total abdominal hysterectomy) 04/26/2020  ? Encounter for screening colonoscopy   ? Polyp of colon   ? History of renal calculi 12/24/2019   ? Mild intermittent asthma without complication 33/29/5188  ? Constipation 12/24/2019  ? Primary osteoarthritis of left knee 12/24/2019  ? Mixed hyperlipidemia 12/24/2019  ? BMI 35.0-35.9,adult 12/24/2019  ? History of palpitations 12/24/2019  ? ? ?Allergies  ?Allergen Reactions  ? Aspirin Hives  ? Penicillins Hives  ? ? ?Past Surgical History:  ?Procedure Laterality Date  ? ABDOMINAL HYSTERECTOMY  2018  ? only ovaries remain  ? COLONOSCOPY WITH PROPOFOL N/A 01/24/2020  ? Procedure: COLONOSCOPY WITH PROPOFOL;  Surgeon: Lucilla Lame, MD;  Location: [facility];  Service: Endoscopy;  Laterality: N/A;  priority 4  ? ESOPHAGOGASTRODUODENOSCOPY (EGD) WITH PROPOFOL N/A 12/10/2020  ? Procedure: ESOPHAGOGASTRODUODENOSCOPY (EGD) WITH PROPOFOL;  Surgeon: Lucilla Lame, MD;  Location: [facility];  Service: Endoscopy;  Laterality: N/A;  ? POLYPECTOMY  01/24/2020  ? Procedure: POLYPECTOMY;  Surgeon: Lucilla Lame, MD;  Location: [facility];  Service: Endoscopy;;  ? ? ?Social History  ? ?Tobacco Use  ? Smoking status: Never  ? Smokeless tobacco: Never  ?Vaping Use  ? Vaping Use: Never used  ?Substance Use Topics  ? Alcohol use: Never  ? Drug use: Never  ? ? ? ?Medication list has been reviewed and updated. ? ?Current Meds  ?Medication Sig  ? EPINEPHrine 0.3 mg/0.3 mL IJ SOAJ injection See admin instructions.  ? mometasone (ELOCON) 0.1 % ointment APPLY TO AFFECTED AREA TOPICALLY EVERY DAY  ? omeprazole (PRILOSEC) 40 MG capsule Take 1 capsule by mouth daily.  ? Plecanatide (TRULANCE) 3 MG TABS Take 1 tablet by mouth daily at 6 (six) AM.  ? spironolactone (ALDACTONE) 100 MG tablet Take 100 mg by mouth daily.  ? traMADol (ULTRAM) 50 MG tablet Take 2 tablets (100 mg total) by mouth every 6 (six) hours as needed.  ? ? ? ?  06/24/2021  ?  8:57 AM 10/23/2020  ?  8:22 AM 04/27/2020  ?  8:37 AM 12/24/2019  ?  1:57 PM  ?PHQ 2/9 Scores  ?PHQ - 2 Score 2 0 0 0  ?PHQ- 9 Score 5 0 0 6  ? ? ? ?  06/24/2021  ?  8:58 AM  10/23/2020  ?  8:22 AM 04/27/2020  ?  8:37 AM 12/24/2019  ?  1:58 PM  ?GAD 7 : Generalized Anxiety Score  ?Nervous, Anxious, on Edge 1 0 1 0  ?Control/stop worrying 1 0 1 0  ?Worry too much - different things 0 0 1 0  ?Trouble relaxing 0 0 0 0  ?Restless 0 0 0 0  ?Easily annoyed or irritable 1 0 1 0  ?Afraid - awful might happen 1 0 1 0  ?Total GAD 7 Score 4 0 5 0  ?Anxiety Difficulty    Not difficult at all  ? ? ?BP Readings from Last 3 Encounters:  ?07/26/21 124/68  ?06/24/21 124/88  ?01/14/21 (!) 156/88  ? ? ?Physical Exam ?Vitals and nursing note reviewed.  ?Constitutional:   ?   General: She is not in acute distress. ?   Appearance: She is well-developed.  ?HENT:  ?   Head: Normocephalic and  atraumatic.  ?   Right Ear: Tympanic membrane and ear canal normal.  ?   Left Ear: Tympanic membrane and ear canal normal.  ?   Nose:  ?   Right Sinus: No maxillary sinus tenderness.  ?   Left Sinus: No maxillary sinus tenderness.  ?Eyes:  ?   General: No scleral icterus.    ?   Right eye: No discharge.     ?   Left eye: No discharge.  ?   Conjunctiva/sclera: Conjunctivae normal.  ?Neck:  ?   Thyroid: No thyromegaly.  ?   Vascular: No carotid bruit.  ?Cardiovascular:  ?   Rate and Rhythm: Normal rate and regular rhythm.  ?   Pulses: Normal pulses.  ?   Heart sounds: Normal heart sounds.  ?Pulmonary:  ?   Effort: Pulmonary effort is normal. No respiratory distress.  ?   Breath sounds: No wheezing.  ?Chest:  ?Breasts: ?   Right: No mass, nipple discharge, skin change or tenderness.  ?   Left: No mass, nipple discharge, skin change or tenderness.  ?Abdominal:  ?   General: Bowel sounds are normal.  ?   Palpations: Abdomen is soft.  ?   Tenderness: There is no abdominal tenderness.  ?Musculoskeletal:  ?   Cervical back: Normal range of motion. No erythema.  ?   Right lower leg: No edema.  ?   Left lower leg: No edema.  ?Lymphadenopathy:  ?   Cervical: No cervical adenopathy.  ?Skin: ?   General: Skin is warm and dry.  ?    Findings: No rash.  ?Neurological:  ?   Mental Status: She is alert and oriented to person, place, and time.  ?   Cranial Nerves: No cranial nerve deficit.  ?   Sensory: No sensory deficit.  ?   Deep Tendon Reflex

## 2021-07-28 LAB — COMPREHENSIVE METABOLIC PANEL
ALT: 4 IU/L (ref 0–32)
AST: 13 IU/L (ref 0–40)
Albumin/Globulin Ratio: 1.2 (ref 1.2–2.2)
Albumin: 3.9 g/dL (ref 3.8–4.9)
Alkaline Phosphatase: 69 IU/L (ref 44–121)
BUN/Creatinine Ratio: 11 (ref 9–23)
BUN: 11 mg/dL (ref 6–24)
Bilirubin Total: 0.2 mg/dL (ref 0.0–1.2)
CO2: 25 mmol/L (ref 20–29)
Calcium: 9.5 mg/dL (ref 8.7–10.2)
Chloride: 101 mmol/L (ref 96–106)
Creatinine, Ser: 0.97 mg/dL (ref 0.57–1.00)
Globulin, Total: 3.2 g/dL (ref 1.5–4.5)
Glucose: 95 mg/dL (ref 70–99)
Potassium: 3.7 mmol/L (ref 3.5–5.2)
Sodium: 139 mmol/L (ref 134–144)
Total Protein: 7.1 g/dL (ref 6.0–8.5)
eGFR: 70 mL/min/{1.73_m2} (ref >59)

## 2021-07-28 LAB — CBC WITH DIFFERENTIAL/PLATELET
Basophils Absolute: 0 10*3/uL (ref 0.0–0.2)
Basos: 0 %
EOS (ABSOLUTE): 0.1 10*3/uL (ref 0.0–0.4)
Eos: 1 %
Hematocrit: 35.7 % (ref 34.0–46.6)
Hemoglobin: 11.6 g/dL (ref 11.1–15.9)
Immature Grans (Abs): 0 10*3/uL (ref 0.0–0.1)
Immature Granulocytes: 0 %
Lymphocytes Absolute: 2.5 10*3/uL (ref 0.7–3.1)
Lymphs: 38 %
MCH: 27.4 pg (ref 26.6–33.0)
MCHC: 32.5 g/dL (ref 31.5–35.7)
MCV: 84 fL (ref 79–97)
Monocytes Absolute: 0.5 10*3/uL (ref 0.1–0.9)
Monocytes: 8 %
Neutrophils Absolute: 3.5 10*3/uL (ref 1.4–7.0)
Neutrophils: 53 %
Platelets: 326 10*3/uL (ref 150–450)
RBC: 4.24 x10E6/uL (ref 3.77–5.28)
RDW: 14.1 % (ref 11.7–15.4)
WBC: 6.6 10*3/uL (ref 3.4–10.8)

## 2021-07-28 LAB — QUANTIFERON-TB GOLD PLUS
QuantiFERON Mitogen Value: >10 IU/mL
QuantiFERON Nil Value: 0.02 IU/mL
QuantiFERON TB1 Ag Value: 0.06 IU/mL
QuantiFERON TB2 Ag Value: 0.05 IU/mL
QuantiFERON-TB Gold Plus: NEGATIVE

## 2021-07-28 LAB — LIPID PANEL
Chol/HDL Ratio: 4.5 ratio — ABNORMAL HIGH (ref 0.0–4.4)
Cholesterol, Total: 263 mg/dL — ABNORMAL HIGH (ref 100–199)
HDL: 58 mg/dL (ref >39)
LDL Chol Calc (NIH): 186 mg/dL — ABNORMAL HIGH (ref 0–99)
Triglycerides: 109 mg/dL (ref 0–149)
VLDL Cholesterol Cal: 19 mg/dL (ref 5–40)

## 2021-07-28 LAB — TSH: TSH: 4.15 u[IU]/mL (ref 0.450–4.500)

## 2021-07-28 LAB — HIV ANTIBODY (ROUTINE TESTING W REFLEX): HIV Screen 4th Generation wRfx: NONREACTIVE

## 2021-07-28 LAB — HEMOGLOBIN A1C
Est. average glucose Bld gHb Est-mCnc: 123 mg/dL
Hgb A1c MFr Bld: 5.9 % — ABNORMAL HIGH (ref 4.8–5.6)

## 2021-08-04 ENCOUNTER — Other Ambulatory Visit: Payer: Self-pay | Admitting: Internal Medicine

## 2021-08-04 DIAGNOSIS — L309 Dermatitis, unspecified: Secondary | ICD-10-CM

## 2021-08-04 MED ORDER — MOMETASONE FUROATE 0.1 % EX OINT: TOPICAL_OINTMENT | Freq: Every day | CUTANEOUS | 0 refills | Status: DC

## 2021-10-07 ENCOUNTER — Ambulatory Visit: Payer: 59

## 2021-10-12 ENCOUNTER — Ambulatory Visit
Admission: RE | Admit: 2021-10-12 | Discharge: 2021-10-12 | Disposition: A | Discharge status: Home / Self Care | Payer: 59 | Source: Ambulatory Visit | Attending: Internal Medicine | Admitting: Internal Medicine

## 2021-10-12 DIAGNOSIS — N838 Other noninflammatory disorders of ovary, fallopian tube and broad ligament: Secondary | ICD-10-CM | POA: Insufficient documentation

## 2021-10-13 ENCOUNTER — Other Ambulatory Visit: Payer: Self-pay

## 2021-10-13 ENCOUNTER — Telehealth: Payer: Self-pay

## 2021-10-13 DIAGNOSIS — N838 Other noninflammatory disorders of ovary, fallopian tube and broad ligament: Secondary | ICD-10-CM

## 2021-10-13 NOTE — Telephone Encounter (Signed)
Pt wants to know if she could get a medication for pain she said she has taken Tylenol 500 MG- 4 tablets as well as Aleve. She stated the pain is unbearable.  KP

## 2021-10-13 NOTE — Telephone Encounter (Signed)
Pt informed. She verbalized understanding.  KP

## 2021-10-20 ENCOUNTER — Telehealth: Payer: Self-pay | Admitting: Internal Medicine

## 2021-10-20 ENCOUNTER — Other Ambulatory Visit: Payer: Self-pay | Admitting: Internal Medicine

## 2021-10-20 DIAGNOSIS — N838 Other noninflammatory disorders of ovary, fallopian tube and broad ligament: Secondary | ICD-10-CM

## 2021-10-20 NOTE — Telephone Encounter (Signed)
Please review.  KP

## 2021-10-20 NOTE — Telephone Encounter (Signed)
Informed pt that additional labs need to done and they are ordered. Pt verbalized understanding and will go in tomorrow to get his labs done.  KP

## 2021-10-20 NOTE — Telephone Encounter (Signed)
Copied from [place]. Topic: General - Other >> Oct 20, 2021 12:55 PM Oley Balm E wrote: Reason for CRM: Thedore Mins called from [facility] requesting labs. They need this to determine who would be best to see the patient    [id number]  He4  Fax: [phone] Best contact: [phone] option 1

## 2021-10-22 LAB — OVARIAN CANCER MONITOR
Cancer Antigen (CA) 125: 9.8 U/mL (ref 0.0–38.1)
HE4: 43.5 pmol/L (ref 0.0–105.2)

## 2022-02-21 ENCOUNTER — Other Ambulatory Visit: Payer: Self-pay | Admitting: Internal Medicine

## 2022-02-21 DIAGNOSIS — L309 Dermatitis, unspecified: Secondary | ICD-10-CM

## 2022-02-22 NOTE — Telephone Encounter (Signed)
Requested medication (s) are due for refill today: yes  Requested medication (s) are on the active medication list: yes  Last refill:  08/04/21  Future visit scheduled: yes  Notes to clinic:  Medication not assigned to a protocol, review manually.     Requested Prescriptions  Pending Prescriptions Disp Refills   mometasone (ELOCON) 0.1 % ointment [Pharmacy Med Name: MOMETASONE FUROATE 0.1% OINT] 45 g 0    Sig: APPLY TO AFFECTED AREA TOPICALLY EVERY DAY     Off-Protocol Failed - 02/21/2022 11:44 AM      Failed - Medication not assigned to a protocol, review manually.      Passed - Valid encounter within last 12 months    Recent Outpatient Visits           7 months ago Annual physical exam   [place] [facility] at [facility], Jesse Sans, MD   8 months ago Constipation, unspecified constipation type   [facility] at [facility], Jesse Sans, MD   1 year ago Breast pain in female   [place] and Sports Medicine at [facility], Jesse Sans, MD   1 year ago Annual physical exam   [facility] at [facility], Jesse Sans, MD   2 years ago History of renal calculi   [facility] at [facility], Jesse Sans, MD       Future Appointments             In 5 months [facility], Jesse Sans, MD [place] [facility] Medicine at [facility], [facility]

## 2022-03-20 ENCOUNTER — Ambulatory Visit
Admission: EM | Admit: 2022-03-20 | Discharge: 2022-03-20 | Disposition: A | Discharge status: Home / Self Care | Payer: 59 | Attending: Emergency Medicine | Admitting: Emergency Medicine

## 2022-03-20 DIAGNOSIS — M5442 Lumbago with sciatica, left side: Secondary | ICD-10-CM | POA: Diagnosis not present

## 2022-03-20 DIAGNOSIS — M62838 Other muscle spasm: Secondary | ICD-10-CM

## 2022-03-20 DIAGNOSIS — G8929 Other chronic pain: Secondary | ICD-10-CM | POA: Diagnosis not present

## 2022-03-20 MED ORDER — ACETAMINOPHEN 500 MG PO TABS
1000.0000 mg | ORAL_TABLET | Freq: Once | ORAL | Status: AC
  Administered 2022-03-20: 1000 mg via ORAL

## 2022-03-20 MED ORDER — NAPROXEN 500 MG PO TABS: 500.0000 mg | ORAL_TABLET | Freq: Two times a day (BID) | ORAL | 0 refills | Status: DC

## 2022-03-20 MED ORDER — KETOROLAC TROMETHAMINE 30 MG/ML IJ SOLN
30.0000 mg | Freq: Once | INTRAMUSCULAR | Status: AC
  Administered 2022-03-20: 30 mg via INTRAMUSCULAR

## 2022-03-20 MED ORDER — TIZANIDINE HCL 4 MG PO TABS: 4.0000 mg | ORAL_TABLET | Freq: Three times a day (TID) | ORAL | 0 refills | Status: DC | PRN

## 2022-03-20 MED ORDER — METHYLPREDNISOLONE 4 MG PO TBPK: ORAL_TABLET | Freq: Every day | ORAL | 0 refills | Status: DC

## 2022-03-20 NOTE — ED Provider Notes (Signed)
HPI  SUBJECTIVE:  Kendra Ford is a 51 y.o. female who presents with an acute flare of her chronic back pain starting about 2 weeks ago.  She says it starts in her left lower back and radiates down her anterior/lateral leg to the knee.  She reports muscle spasms and states that the pain is daily, constant.  She also reports numbness and tingling in the left arm that she gets with this back pain.  She denies chest pain, shortness of breath, nausea, palpitations.  No trauma to the back, change in physical activity, syncope, seizure, urinary fecal incontinence, urinary retention, abdominal pain, urinary complaints.  She reports left leg weakness secondary to the pain.  No fevers, night sweats, unintentional weight loss.  She has a past medical history of chronic back pain with left arm numbness and tingling, and sciatica, asthma, hypertension, nephrolithiasis.  No history of diabetes, chronic kidney disease, osteoporosis, aortic abdominal aneurysm, HIV. PCP primary care [place]  Past Medical History:  Diagnosis Date   Arthritis    left knee   Asthma    Chronic back pain    Hyperlipidemia    Hypertension    Recurrent kidney stones     Past Surgical History:  Procedure Laterality Date   ABDOMINAL HYSTERECTOMY  2018   Cervix absent - only ovaries remain   COLONOSCOPY WITH PROPOFOL N/A 01/24/2020   Procedure: COLONOSCOPY WITH PROPOFOL;  Surgeon: Lucilla Lame, MD;  Location: [facility];  Service: Endoscopy;  Laterality: N/A;  priority 4   ESOPHAGOGASTRODUODENOSCOPY (EGD) WITH PROPOFOL N/A 12/10/2020   Procedure: ESOPHAGOGASTRODUODENOSCOPY (EGD) WITH PROPOFOL;  Surgeon: Lucilla Lame, MD;  Location: [facility];  Service: Endoscopy;  Laterality: N/A;   POLYPECTOMY  01/24/2020   Procedure: POLYPECTOMY;  Surgeon: Lucilla Lame, MD;  Location: [facility];  Service: Endoscopy;;    Family History  Problem Relation Age of Onset   Hypertension Mother    Hyperlipidemia  Mother    Liver disease Mother    Breast cancer Neg Hx     Social History   Tobacco Use   Smoking status: Never   Smokeless tobacco: Never  Vaping Use   Vaping Use: Never used  Substance Use Topics   Alcohol use: Never   Drug use: Never    No current facility-administered medications for this encounter.  Current Outpatient Medications:    methylPREDNISolone (MEDROL DOSEPAK) 4 MG TBPK tablet, Take by mouth daily. Follow package instructions, Disp: 21 tablet, Rfl: 0   naproxen (NAPROSYN) 500 MG tablet, Take 1 tablet (500 mg total) by mouth 2 (two) times daily., Disp: 20 tablet, Rfl: 0   tiZANidine (ZANAFLEX) 4 MG tablet, Take 1 tablet (4 mg total) by mouth every 8 (eight) hours as needed for muscle spasms., Disp: 30 tablet, Rfl: 0   EPINEPHrine 0.3 mg/0.3 mL IJ SOAJ injection, See admin instructions., Disp: , Rfl:    mometasone (ELOCON) 0.1 % ointment, APPLY TO AFFECTED AREA TOPICALLY EVERY DAY, Disp: 45 g, Rfl: 0   omeprazole (PRILOSEC) 40 MG capsule, Take 1 capsule by mouth daily., Disp: , Rfl:    Plecanatide (TRULANCE) 3 MG TABS, Take 1 tablet by mouth daily at 6 (six) AM., Disp: 90 tablet, Rfl: 0   spironolactone (ALDACTONE) 100 MG tablet, Take 100 mg by mouth daily., Disp: , Rfl:   Allergies  Allergen Reactions   Aspirin Hives   Penicillins Hives     ROS  As noted in HPI.   Physical Exam  BP Marland Kitchen)  159/97 (BP Location: Right Arm)   Pulse 67   Temp 98.3 F (36.8 C) (Oral)   Ht '5\' 9"'$  (1.753 m)   Wt 106.6 kg   SpO2 95%   BMI 34.70 kg/m   Constitutional: Well developed, well nourished, appears uncomfortable Eyes:  EOMI, conjunctiva normal bilaterally HENT: Normocephalic, atraumatic,mucus membranes moist Respiratory: Normal inspiratory effort Cardiovascular: Normal rate GI: nondistended. No suprapubic tenderness skin: No rash, skin intact Musculoskeletal: no CVAT.  Positive bilateral paralumbar tenderness worse in the left, positive muscle spasm.  No L-spine, SI  joint, sacral bony tenderness. Bilateral lower extremities nontender, baseline ROM with intact PT pulses.  Sensation between thighs intact.  Pain aggravated with left hip flexion/extension against resistance.  No pain with passive int/ext rotation, AD/ABduction bilaterally. SLR positive left side.  Sensation baseline light touch bilaterally for Pt, DTR's symmetric and intact bilaterally KJ , Motor symmetric bilateral 5/5 hip flexion, quadriceps, hamstrings, EHL, foot dorsiflexion, foot plantarflexion, gait somewhat antalgic but without apparent new ataxia. Tenderness, spasm along the left trapezius.  Strength in the shoulders, biceps 5/5 and equal bilaterally.  RP 2+ bilaterally.  Sensation and motor grossly intact in the median/radial/ulnar distribution and equal bilaterally. Neurologic: Alert & oriented x 3, no focal neuro deficits Psychiatric: Speech and behavior appropriate   ED Course   Medications  acetaminophen (TYLENOL) tablet 1,000 mg (1,000 mg Oral Given 03/20/22 1121)  ketorolac (TORADOL) 30 MG/ML injection 30 mg (30 mg Intramuscular Given 03/20/22 1122)    Orders Placed This Encounter  Procedures   AMB referral to sports medicine    Referral Priority:   Urgent    Referral Type:   Consultation    Referred to Provider:   Montel Culver, MD    Number of Visits Requested:   1    No results found for this or any previous visit (from the past 24 hour(s)). No results found.  ED Clinical Impression  1. Acute midline low back pain with left-sided sciatica   2. Chronic midline low back pain with left-sided sciatica   3. Trapezius muscle spasm    ED Assessment/Plan     Patient with an acute flare of her chronic back pain.  She states this is identical to previous flares which usually respond to steroids and muscle relaxants.  The numbness and tingling in her left arm is not something new or different, but accompanies flares of back pain.  Doubt ACS.  Will give Toradol and  Tylenol here, send home with Naprosyn/Tylenol.  Patient states that she has tolerated these NSAIDs in the past without any issues.  Medrol Dosepak, Zanaflex, heat, gentle stretching, will place urgent referral to Dr. Zigmund Daniel.  Patient is [facility] a patient at [facility].  No evidence of spinal cord involvement based on H&P. Pt describing typical back pain, has been < 6 week duration. No historical red flags as noted in HPI. No physical red flags such as fever, bony tenderness, lower extremity weakness, saddle anesthesia. Imaging not indicated at this time.   Discussed medical decision-making, and plan for follow-up with the patient.  Discussed signs and symptoms that should prompt return to the emergency department.  Patient agrees with plan.  Meds ordered this encounter  Medications   acetaminophen (TYLENOL) tablet 1,000 mg   ketorolac (TORADOL) 30 MG/ML injection 30 mg   tiZANidine (ZANAFLEX) 4 MG tablet    Sig: Take 1 tablet (4 mg total) by mouth every 8 (eight) hours as needed for muscle spasms.  Dispense:  30 tablet    Refill:  0   methylPREDNISolone (MEDROL DOSEPAK) 4 MG TBPK tablet    Sig: Take by mouth daily. Follow package instructions    Dispense:  21 tablet    Refill:  0   naproxen (NAPROSYN) 500 MG tablet    Sig: Take 1 tablet (500 mg total) by mouth 2 (two) times daily.    Dispense:  20 tablet    Refill:  0    *This clinic note was created using [employer]. Therefore, there may be occasional mistakes despite careful proofreading.  ?     Melynda Ripple, MD 03/22/22 [phone]

## 2022-03-20 NOTE — ED Triage Notes (Signed)
Patient reports that she has a pain that she has a pain that starts in the mid to lower back that is running down her left leg -- started about 2 weeks ago.   Patient reports years ago she popped the middle of her back and this has been happening on and off since then.

## 2022-03-20 NOTE — Discharge Instructions (Signed)
Take the Naprosyn with 1000 mg of Tylenol twice a day.  May take an additional 1000 mg of Tylenol 1 more time a day.  Zanaflex for muscle spasms, heat, gentle stretching.  Finish steroids, unless a healthcare provider tells you to stop.  I have placed an urgent referral to Dr. Zigmund Daniel, sports medicine.

## 2022-07-29 ENCOUNTER — Encounter: Payer: 59 | Admitting: Internal Medicine

## 2022-08-22 ENCOUNTER — Encounter: Payer: 59 | Admitting: Internal Medicine

## 2022-08-22 NOTE — Progress Notes (Deleted)
Date:  08/22/2022   Name:  Kendra Ford   DOB:  1970/12/05   MRN:  [id number]   Chief Complaint: No chief complaint on file. Kendra Ford is a 52 y.o. female who presents today for her Complete Annual Exam. She feels {DESC; WELL/FAIRLY WELL/POORLY:18703}. She reports exercising ***. She reports she is sleeping {DESC; WELL/FAIRLY WELL/POORLY:18703}. Breast complaints ***.  Mammogram: 01/2020 DEXA: none Pap smear: discontinued Colonoscopy: 01/2020 repeat 7 yrs  Health Maintenance Due  Topic Date Due   DTaP/Tdap/Td (1 - Tdap) Never done   Zoster Vaccines- Shingrix (1 of 2) Never done   MAMMOGRAM  02/04/2021   COVID-19 Vaccine (3 - 2023-24 season) 12/31/2021    Immunization History  Administered Date(s) Administered   PFIZER(Purple Top)SARS-COV-2 Vaccination 12/01/2019, 12/20/2019    HPI  Lab Results  Component Value Date   NA 139 07/26/2021   K 3.7 07/26/2021   CO2 25 07/26/2021   GLUCOSE 95 07/26/2021   BUN 11 07/26/2021   CREATININE 0.97 07/26/2021   CALCIUM 9.5 07/26/2021   EGFR 70 07/26/2021   GFRNONAA 67 04/27/2020   Lab Results  Component Value Date   CHOL 263 (H) 07/26/2021   HDL 58 07/26/2021   LDLCALC 186 (H) 07/26/2021   TRIG 109 07/26/2021   CHOLHDL 4.5 (H) 07/26/2021   Lab Results  Component Value Date   TSH 4.150 07/26/2021   Lab Results  Component Value Date   HGBA1C 5.9 (H) 07/26/2021   Lab Results  Component Value Date   WBC 6.6 07/26/2021   HGB 11.6 07/26/2021   HCT 35.7 07/26/2021   MCV 84 07/26/2021   PLT 326 07/26/2021   Lab Results  Component Value Date   ALT 4 07/26/2021   AST 13 07/26/2021   ALKPHOS 69 07/26/2021   BILITOT 0.2 07/26/2021   No results found for: "25OHVITD2", "25OHVITD3", "VD25OH"   Review of Systems  Constitutional:  Negative for chills, fatigue and fever.  HENT:  Negative for congestion, hearing loss, tinnitus, trouble swallowing and voice change.   Eyes:  Negative for visual  disturbance.  Respiratory:  Negative for cough, chest tightness, shortness of breath and wheezing.   Cardiovascular:  Negative for chest pain, palpitations and leg swelling.  Gastrointestinal:  Negative for abdominal pain, constipation, diarrhea and vomiting.  Endocrine: Negative for polydipsia and polyuria.  Genitourinary:  Negative for dysuria, frequency, genital sores, vaginal bleeding and vaginal discharge.  Musculoskeletal:  Negative for arthralgias, gait problem and joint swelling.  Skin:  Negative for color change and rash.  Neurological:  Negative for dizziness, tremors, light-headedness and headaches.  Hematological:  Negative for adenopathy. Does not bruise/bleed easily.  Psychiatric/Behavioral:  Negative for dysphoric mood and sleep disturbance. The patient is not nervous/anxious.     Patient Active Problem List   Diagnosis Date Noted   Dysphagia    Prediabetes 04/28/2020   Eczema 04/27/2020   Recurrent major depressive disorder, in full remission 04/27/2020   S/P TAH (total abdominal hysterectomy) 04/26/2020   Polyp of colon    History of renal calculi 12/24/2019   Mild intermittent asthma without complication 12/24/2019   Chronic idiopathic constipation 12/24/2019   Primary osteoarthritis of left knee 12/24/2019   Mixed hyperlipidemia 12/24/2019   BMI 35.0-35.9,adult 12/24/2019   History of palpitations 12/24/2019    Allergies  Allergen Reactions   Aspirin Hives   Penicillins Hives    Past Surgical History:  Procedure Laterality Date   ABDOMINAL HYSTERECTOMY  2018  Cervix absent - only ovaries remain   COLONOSCOPY WITH PROPOFOL N/A 01/24/2020   Procedure: COLONOSCOPY WITH PROPOFOL;  Surgeon: Midge Minium, MD;  Location: [facility];  Service: Endoscopy;  Laterality: N/A;  priority 4   ESOPHAGOGASTRODUODENOSCOPY (EGD) WITH PROPOFOL N/A 12/10/2020   Procedure: ESOPHAGOGASTRODUODENOSCOPY (EGD) WITH PROPOFOL;  Surgeon: Midge Minium, MD;  Location: [facility];  Service: Endoscopy;  Laterality: N/A;   POLYPECTOMY  01/24/2020   Procedure: POLYPECTOMY;  Surgeon: Midge Minium, MD;  Location: [facility];  Service: Endoscopy;;    Social History   Tobacco Use   Smoking status: Never   Smokeless tobacco: Never  Vaping Use   Vaping Use: Never used  Substance Use Topics   Alcohol use: Never   Drug use: Never     Medication list has been reviewed and updated.  No outpatient medications have been marked as taking for the 08/22/22 encounter (Appointment) with Reubin Milan, MD.       07/26/2021    8:09 AM 06/24/2021    8:58 AM 10/23/2020    8:22 AM 04/27/2020    8:37 AM  GAD 7 : Generalized Anxiety Score  Nervous, Anxious, on Edge 1 1 0 1  Control/stop worrying 1 1 0 1  Worry too much - different things 1 0 0 1  Trouble relaxing 1 0 0 0  Restless 0 0 0 0  Easily annoyed or irritable 1 1 0 1  Afraid - awful might happen 1 1 0 1  Total GAD 7 Score 6 4 0 5  Anxiety Difficulty Not difficult at all          07/26/2021    8:08 AM 06/24/2021    8:57 AM 10/23/2020    8:22 AM  Depression screen PHQ 2/9  Decreased Interest 1 1 0  Down, Depressed, Hopeless 1 1 0  PHQ - 2 Score 2 2 0  Altered sleeping 3 1 0  Tired, decreased energy 0 1 0  Change in appetite 0 1 0  Feeling bad or failure about yourself  0 0 0  Trouble concentrating 0 0 0  Moving slowly or fidgety/restless 0 0 0  Suicidal thoughts 0 0 0  PHQ-9 Score 5 5 0  Difficult doing work/chores Not difficult at all Somewhat difficult Not difficult at all    BP Readings from Last 3 Encounters:  03/20/22 (!) 159/97  07/26/21 124/68  06/24/21 124/88    Physical Exam Vitals and nursing note reviewed.  Constitutional:      General: She is not in acute distress.    Appearance: She is well-developed.  HENT:     Head: Normocephalic and atraumatic.     Right Ear: Tympanic membrane and ear canal normal.     Left Ear: Tympanic membrane and ear canal normal.      Nose:     Right Sinus: No maxillary sinus tenderness.     Left Sinus: No maxillary sinus tenderness.  Eyes:     General: No scleral icterus.       Right eye: No discharge.        Left eye: No discharge.     Conjunctiva/sclera: Conjunctivae normal.  Neck:     Thyroid: No thyromegaly.     Vascular: No carotid bruit.  Cardiovascular:     Rate and Rhythm: Normal rate and regular rhythm.     Pulses: Normal pulses.     Heart sounds: Normal heart sounds.  Pulmonary:  Effort: Pulmonary effort is normal. No respiratory distress.     Breath sounds: No wheezing.  Chest:  Breasts:    Right: No mass, nipple discharge, skin change or tenderness.     Left: No mass, nipple discharge, skin change or tenderness.  Abdominal:     General: Bowel sounds are normal.     Palpations: Abdomen is soft.     Tenderness: There is no abdominal tenderness.  Musculoskeletal:     Cervical back: Normal range of motion. No erythema.     Right lower leg: No edema.     Left lower leg: No edema.  Lymphadenopathy:     Cervical: No cervical adenopathy.  Skin:    General: Skin is warm and dry.     Findings: No rash.  Neurological:     Mental Status: She is alert and oriented to person, place, and time.     Cranial Nerves: No cranial nerve deficit.     Sensory: No sensory deficit.     Deep Tendon Reflexes: Reflexes are normal and symmetric.  Psychiatric:        Attention and Perception: Attention normal.        Mood and Affect: Mood normal.     Wt Readings from Last 3 Encounters:  03/20/22 235 lb (106.6 kg)  07/26/21 236 lb (107 kg)  06/24/21 236 lb (107 kg)    There were no vitals taken for this visit.  Assessment and Plan:  Problem List Items Addressed This Visit       Digestive   Chronic idiopathic constipation    Doing well on Trulance 3 mg Last colonoscopy 2021        Other   Mixed hyperlipidemia    LDL is  Lab Results  Component Value Date   LDLCALC 186 (H) 07/26/2021  On diet  only        Prediabetes    Diet controlled at this time. Lab Results  Component Value Date   HGBA1C 5.9 (H) 07/26/2021        Other Visit Diagnoses     Annual physical exam    -  Primary   Encounter for screening mammogram for breast cancer           No follow-ups on file.   Partially dictated using Dragon software, any errors are not intentional.  Reubin Milan, MD [facility] [place], [place]

## 2022-08-22 NOTE — Assessment & Plan Note (Deleted)
Doing well on Trulance 3 mg Last colonoscopy 2021

## 2022-08-22 NOTE — Assessment & Plan Note (Deleted)
Diet controlled at this time. Lab Results  Component Value Date   HGBA1C 5.9 (H) 07/26/2021

## 2022-08-22 NOTE — Assessment & Plan Note (Deleted)
LDL is  Lab Results  Component Value Date   LDLCALC 186 (H) 07/26/2021  On diet only

## 2022-09-18 ENCOUNTER — Ambulatory Visit
Admission: RE | Admit: 2022-09-18 | Discharge: 2022-09-18 | Disposition: A | Discharge status: Home / Self Care | Payer: 59 | Source: Ambulatory Visit | Attending: Emergency Medicine | Admitting: Emergency Medicine

## 2022-09-18 VITALS — BP 160/84 | HR 72 | Temp 98.5°F | Resp 15 | Ht 69.0 in | Wt 235.0 lb

## 2022-09-18 DIAGNOSIS — G8929 Other chronic pain: Secondary | ICD-10-CM

## 2022-09-18 DIAGNOSIS — M5441 Lumbago with sciatica, right side: Secondary | ICD-10-CM

## 2022-09-18 DIAGNOSIS — M5010 Cervical disc disorder with radiculopathy, unspecified cervical region: Secondary | ICD-10-CM | POA: Diagnosis not present

## 2022-09-18 MED ORDER — PREDNISONE 10 MG (21) PO TBPK: ORAL_TABLET | ORAL | 0 refills | Status: DC

## 2022-09-18 MED ORDER — KETOROLAC TROMETHAMINE 30 MG/ML IJ SOLN
30.0000 mg | Freq: Once | INTRAMUSCULAR | Status: AC
  Administered 2022-09-18: 30 mg via INTRAMUSCULAR

## 2022-09-18 MED ORDER — BACLOFEN 10 MG PO TABS: 10.0000 mg | ORAL_TABLET | Freq: Three times a day (TID) | ORAL | 0 refills | Status: DC

## 2022-09-18 NOTE — ED Provider Notes (Signed)
[facility]    CSN: [id number] Arrival date & time: 09/18/22  1239      History   Chief Complaint Chief Complaint  Patient presents with   Back Pain    Appointment   Neck Pain    HPI Kendra Ford is a 52 y.o. female.   HPI  52 year old female with a past medical history significant for chronic back pain, asthma, arthritis, hypertension, hyperlipidemia presents for evaluation of low back pain that has been going on for the past 2 weeks.  Patient was evaluated in this urgent care on 03/20/2022 and was prescribed tizanidine, naproxen, and methylprednisolone for low back pain and was referred to Dr. Ashley Royalty for sports medicine referral.  Patient reports that she is also experiencing numbness in all of the fingers of her right hand.  No injury or precipitating event that the patient is aware of.  Past Medical History:  Diagnosis Date   Arthritis    left knee   Asthma    Chronic back pain    Hyperlipidemia    Hypertension    Recurrent kidney stones     Patient Active Problem List   Diagnosis Date Noted   Dysphagia    Prediabetes 04/28/2020   Eczema 04/27/2020   Recurrent major depressive disorder, in full remission (HCC) 04/27/2020   S/P TAH (total abdominal hysterectomy) 04/26/2020   Polyp of colon    History of renal calculi 12/24/2019   Mild intermittent asthma without complication 12/24/2019   Chronic idiopathic constipation 12/24/2019   Primary osteoarthritis of left knee 12/24/2019   Mixed hyperlipidemia 12/24/2019   BMI 35.0-35.9,adult 12/24/2019   History of palpitations 12/24/2019    Past Surgical History:  Procedure Laterality Date   ABDOMINAL HYSTERECTOMY  2018   Cervix absent - only ovaries remain   COLONOSCOPY WITH PROPOFOL N/A 01/24/2020   Procedure: COLONOSCOPY WITH PROPOFOL;  Surgeon: Midge Minium, MD;  Location: [facility];  Service: Endoscopy;  Laterality: N/A;  priority 4   ESOPHAGOGASTRODUODENOSCOPY (EGD) WITH  PROPOFOL N/A 12/10/2020   Procedure: ESOPHAGOGASTRODUODENOSCOPY (EGD) WITH PROPOFOL;  Surgeon: Midge Minium, MD;  Location: [facility];  Service: Endoscopy;  Laterality: N/A;   POLYPECTOMY  01/24/2020   Procedure: POLYPECTOMY;  Surgeon: Midge Minium, MD;  Location: [facility];  Service: Endoscopy;;    OB History   No obstetric history on file.      Home Medications    Prior to Admission medications   Medication Sig Start Date End Date Taking? Authorizing Provider  baclofen (LIORESAL) 10 MG tablet Take 1 tablet (10 mg total) by mouth 3 (three) times daily. 09/18/22  Yes Becky Augusta, NP  predniSONE (STERAPRED UNI-PAK 21 TAB) 10 MG (21) TBPK tablet Take 6 tablets on day 1, 5 tablets day 2, 4 tablets day 3, 3 tablets day 4, 2 tablets day 5, 1 tablet day 6 09/18/22  Yes Becky Augusta, NP  EPINEPHrine 0.3 mg/0.3 mL IJ SOAJ injection See admin instructions. 09/25/20   [provider]  naproxen (NAPROSYN) 500 MG tablet Take 1 tablet (500 mg total) by mouth 2 (two) times daily. 03/20/22   Domenick Gong, MD    Family History Family History  Problem Relation Age of Onset   Hypertension Mother    Hyperlipidemia Mother    Liver disease Mother    Breast cancer Neg Hx     Social History Social History   Tobacco Use   Smoking status: Never   Smokeless tobacco: Never  Vaping  Use   Vaping Use: Never used  Substance Use Topics   Alcohol use: Never   Drug use: Never     Allergies   Aspirin and Penicillins   Review of Systems Review of Systems  Musculoskeletal:  Positive for back pain and neck pain.  Neurological:  Positive for weakness and numbness.     Physical Exam Triage Vital Signs ED Triage Vitals  Enc Vitals Group     BP      Pulse      Resp      Temp      Temp src      SpO2      Weight      Height      Head Circumference      Peak Flow      Pain Score      Pain Loc      Pain Edu?      Excl. in GC?    No data found.  Updated  Vital Signs BP (!) 160/84 (BP Location: Right Arm)   Pulse 72   Temp 98.5 F (36.9 C) (Oral)   Resp 15   Ht 5\' 9"  (1.753 m)   Wt 235 lb 0.2 oz (106.6 kg)   SpO2 96%   BMI 34.71 kg/m   Visual Acuity Right Eye Distance:   Left Eye Distance:   Bilateral Distance:    Right Eye Near:   Left Eye Near:    Bilateral Near:     Physical Exam Vitals and nursing note reviewed.  Constitutional:      Appearance: Normal appearance. She is not ill-appearing.  HENT:     Head: Normocephalic and atraumatic.  Musculoskeletal:        General: Tenderness present. No swelling or deformity.  Skin:    General: Skin is warm and dry.     Capillary Refill: Capillary refill takes less than 2 seconds.  Neurological:     General: No focal deficit present.     Mental Status: She is alert and oriented to person, place, and time.     Sensory: No sensory deficit.     Motor: Weakness present.     Deep Tendon Reflexes: Reflexes normal.      UC Treatments / Results  Labs (all labs ordered are listed, but only abnormal results are displayed) Labs Reviewed - No data to display  EKG   Radiology No results found.  Procedures Procedures (including critical care time)  Medications Ordered in UC Medications - No data to display  Initial Impression / Assessment and Plan / UC Course  I have reviewed the triage vital signs and the nursing notes.  Pertinent labs & imaging results that were available during my care of the patient were reviewed by me and considered in my medical decision making (see chart for details).   Patient is a nontoxic-appearing 52 year old female with a past medical history of chronic back pain presenting for evaluation of 2 to 3 weeks worth of neck pain and pain down the entire right side of her spine.  She reports that the pain does radiate down through her right buttock and into her right leg.  She does have a history of sciatica.  She is endorsing some tingling in all the  fingers of her right hand as well as a decrease in her grip strength which predates the pain in her neck.  She has not had any recent injuries.  She was evaluated in November in this  urgent care and referred to Dr. Ashley Royalty but she did not follow-up.  She reports she is never seen a spine specialist.  On exam she is moving all extremities equally but she does have 4/5 grip strength in the right hand versus 5/5 in the left.  Right upper extremity strength is 5/5 as is the left.  Lower extremity strength is also 5/5.  DTRs are 1+ globally.  I suspect that the patient's pain and tingling in her fingers is secondary to cervical radiculopathy.  She has tenderness and spasm all throughout the paraspinous muscle regions from cervical to lumbar on the right side as well as the left.  In clinic and discharged her home on a prednisone taper as well as baclofen.  I am also can make a referral to the spine clinic for further evaluation and manage   Final Clinical Impressions(s) / UC Diagnoses   Final diagnoses:  Cervical disc disorder with radiculopathy of cervical region  Chronic right-sided low back pain with right-sided sciatica     Discharge Instructions      Starting tomorrow morning begin taking the prednisone according to the package instructions.  Take it daily at breakfast time for period of 6 days.  Take the baclofen that I have prescribed for you every 8 hours to help with muscle spasm.  Performed home physical therapy exercises I have given you at discharge to help with your pain symptoms.  I have referred you to the spine clinic for evaluation and treatment of your neck and low back pain.  Please make and keep this appointment as we have exceeded what we are capable of here at urgent care.     ED Prescriptions     Medication Sig Dispense Auth. Provider   baclofen (LIORESAL) 10 MG tablet Take 1 tablet (10 mg total) by mouth 3 (three) times daily. 30 each Becky Augusta, NP   predniSONE  (STERAPRED UNI-PAK 21 TAB) 10 MG (21) TBPK tablet Take 6 tablets on day 1, 5 tablets day 2, 4 tablets day 3, 3 tablets day 4, 2 tablets day 5, 1 tablet day 6 21 tablet Becky Augusta, NP      PDMP not reviewed this encounter.   Becky Augusta, NP 09/18/22 1314

## 2022-09-18 NOTE — ED Triage Notes (Signed)
Patient c/o neck pain that radiates down the right side of her back and down her right leg for the past 2-3 weeks.  Patient denies recent injury or fall.

## 2022-09-18 NOTE — Discharge Instructions (Addendum)
Starting tomorrow morning begin taking the prednisone according to the package instructions.  Take it daily at breakfast time for period of 6 days.  Take the baclofen that I have prescribed for you every 8 hours to help with muscle spasm.  Performed home physical therapy exercises I have given you at discharge to help with your pain symptoms.  I have referred you to the spine clinic for evaluation and treatment of your neck and low back pain.  Please make and keep this appointment as we have exceeded what we are capable of here at urgent care.

## 2022-10-04 ENCOUNTER — Ambulatory Visit: Payer: 59 | Admitting: Orthopaedic Surgery

## 2022-10-11 ENCOUNTER — Other Ambulatory Visit: Payer: Self-pay | Admitting: Internal Medicine

## 2022-10-11 DIAGNOSIS — L309 Dermatitis, unspecified: Secondary | ICD-10-CM

## 2022-10-12 ENCOUNTER — Telehealth: Payer: Self-pay | Admitting: Internal Medicine

## 2022-10-12 ENCOUNTER — Other Ambulatory Visit: Payer: Self-pay | Admitting: Internal Medicine

## 2022-10-12 DIAGNOSIS — L309 Dermatitis, unspecified: Secondary | ICD-10-CM

## 2022-10-12 MED ORDER — MOMETASONE FUROATE 0.1 % EX OINT
TOPICAL_OINTMENT | Freq: Every day | CUTANEOUS | 0 refills | Status: AC
Start: 2022-10-12 — End: ?

## 2022-10-12 NOTE — Telephone Encounter (Signed)
Copied from CRM [id number]. Topic: General - Inquiry >> Oct 12, 2022 11:08 AM Marlow Baars wrote: Reason for CRM: The patient called in wondering why her provider will not refill her cream. Please assist patient further

## 2022-10-12 NOTE — Telephone Encounter (Signed)
Requested medication (s) are due for refill today - no  Requested medication (s) are on the active medication list -no  Future visit scheduled -yes  Last refill: no longer on current medication list  Notes to clinic: off protocol- provider review   Requested Prescriptions  Pending Prescriptions Disp Refills   mometasone (ELOCON) 0.1 % ointment [Pharmacy Med Name: MOMETASONE FUROATE 0.1% OINT] 45 g 0    Sig: APPLY TO AFFECTED AREA TOPICALLY EVERY DAY     Off-Protocol Failed - 10/11/2022 12:56 PM      Failed - Medication not assigned to a protocol, review manually.      Failed - Valid encounter within last 12 months    Recent Outpatient Visits           1 year ago Annual physical exam   [place] [facility] at [facility], Nyoka Cowden, MD   1 year ago Constipation, unspecified constipation type   [facility] at [facility], Nyoka Cowden, MD   1 year ago Breast pain in female   [facility] at [facility], Nyoka Cowden, MD   2 years ago Annual physical exam   [facility] at [facility], Nyoka Cowden, MD   2 years ago History of renal calculi   [facility] at [facility] Nyoka Cowden, MD       Future Appointments             In 1 month Judithann Graves Nyoka Cowden, MD [facility] at [facility], [facility]               Requested Prescriptions  Pending Prescriptions Disp Refills   mometasone (ELOCON) 0.1 % ointment [Pharmacy Med Name: MOMETASONE FUROATE 0.1% OINT] 45 g 0    Sig: APPLY TO AFFECTED AREA TOPICALLY EVERY DAY     Off-Protocol Failed - 10/11/2022 12:56 PM      Failed - Medication not assigned to a protocol, review manually.      Failed - Valid encounter within last 12 months    Recent Outpatient Visits           1 year ago Annual physical exam    [place] [facility] at [facility], Nyoka Cowden, MD   1 year ago Constipation, unspecified constipation type   [facility] Medicine at [facility], Nyoka Cowden, MD   1 year ago Breast pain in female   [facility] Medicine at [facility], Nyoka Cowden, MD   2 years ago Annual physical exam   [facility] Medicine at [facility], Nyoka Cowden, MD   2 years ago History of renal calculi   [facility] at [facility], Nyoka Cowden, MD       Future Appointments             In 1 month Judithann Graves, Nyoka Cowden, MD [facility] at [facility], [facility]

## 2022-10-12 NOTE — Telephone Encounter (Signed)
Pt wants refill on mometasone (ELOCON) 0.1 % ointment [Pharmacy Med Name: MOMETASONE FUROATE 0.1% OINT].  KP

## 2022-11-14 ENCOUNTER — Encounter: Payer: Self-pay | Admitting: Internal Medicine

## 2022-11-14 ENCOUNTER — Ambulatory Visit (INDEPENDENT_AMBULATORY_CARE_PROVIDER_SITE_OTHER): Payer: 59 | Admitting: Internal Medicine

## 2022-11-14 VITALS — BP 126/66 | HR 90 | Ht 69.0 in | Wt 241.0 lb

## 2022-11-14 DIAGNOSIS — M51369 Other intervertebral disc degeneration, lumbar region without mention of lumbar back pain or lower extremity pain: Secondary | ICD-10-CM

## 2022-11-14 DIAGNOSIS — Z1231 Encounter for screening mammogram for malignant neoplasm of breast: Secondary | ICD-10-CM | POA: Diagnosis not present

## 2022-11-14 DIAGNOSIS — E782 Mixed hyperlipidemia: Secondary | ICD-10-CM | POA: Diagnosis not present

## 2022-11-14 DIAGNOSIS — R7303 Prediabetes: Secondary | ICD-10-CM

## 2022-11-14 DIAGNOSIS — M5136 Other intervertebral disc degeneration, lumbar region: Secondary | ICD-10-CM

## 2022-11-14 DIAGNOSIS — Z789 Other specified health status: Secondary | ICD-10-CM

## 2022-11-14 DIAGNOSIS — Z0000 Encounter for general adult medical examination without abnormal findings: Secondary | ICD-10-CM

## 2022-11-14 DIAGNOSIS — Z111 Encounter for screening for respiratory tuberculosis: Secondary | ICD-10-CM

## 2022-11-14 DIAGNOSIS — H548 Legal blindness, as defined in USA: Secondary | ICD-10-CM

## 2022-11-14 NOTE — Patient Instructions (Signed)
Call [facility] to schedule your mammogram at [phone].  

## 2022-11-14 NOTE — Assessment & Plan Note (Signed)
Lipids managed with diet. Lab Results  Component Value Date   LDLCALC 186 (H) 07/26/2021

## 2022-11-14 NOTE — Progress Notes (Signed)
Date:  11/14/2022   Name:  Kendra Ford   DOB:  [birth year]   MRN:  [id number]   Chief Complaint: Annual Exam Kendra Ford is a 52 y.o. female who presents today for her Complete Annual Exam. She feels well. She reports exercising- none. She reports she is sleeping well. Breast complaints - none. 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  Mammogram: 01/2020 DEXA: none Pap smear: discontinued Colonoscopy: 01/2020 repeat 7 yrs  Health Maintenance Due  Topic Date Due   DTaP/Tdap/Td (1 - Tdap) Never done   Zoster Vaccines- Shingrix (1 of 2) Never done   MAMMOGRAM  02/04/2021   COVID-19 Vaccine (3 - 2023-24 season) 12/31/2021    Immunization History  Administered Date(s) Administered   PFIZER(Purple Top)SARS-COV-2 Vaccination 12/01/2019, 12/20/2019    HPI  Lab Results  Component Value Date   NA 139 07/26/2021   K 3.7 07/26/2021   CO2 25 07/26/2021   GLUCOSE 95 07/26/2021   BUN 11 07/26/2021   CREATININE 0.97 07/26/2021   CALCIUM 9.5 07/26/2021   EGFR 70 07/26/2021   GFRNONAA 67 04/27/2020   Lab Results  Component Value Date   CHOL 263 (H) 07/26/2021   HDL 58 07/26/2021   LDLCALC 186 (H) 07/26/2021   TRIG 109 07/26/2021   CHOLHDL 4.5 (H) 07/26/2021   Lab Results  Component Value Date   TSH 4.150 07/26/2021   Lab Results  Component Value Date   HGBA1C 5.9 (H) 07/26/2021   Lab Results  Component Value Date   WBC 6.6 07/26/2021   HGB 11.6 07/26/2021   HCT 35.7 07/26/2021   MCV 84 07/26/2021   PLT 326 07/26/2021   Lab Results  Component Value Date   ALT 4 07/26/2021   AST 13 07/26/2021   ALKPHOS 69 07/26/2021   BILITOT 0.2 07/26/2021   No results found for: "25OHVITD2", "25OHVITD3", "VD25OH"   Review of Systems  Constitutional:  Negative for chills, fatigue and fever.  HENT:  Negative for congestion, hearing loss, tinnitus, trouble swallowing and voice change.   Eyes:  Positive for visual disturbance  (legally blind left since birth).  Respiratory:  Negative for cough, chest tightness, shortness of breath and wheezing.   Cardiovascular:  Negative for chest pain, palpitations and leg swelling.  Gastrointestinal:  Negative for abdominal pain, constipation, diarrhea and vomiting.  Endocrine: Negative for polydipsia and polyuria.  Genitourinary:  Negative for dysuria, frequency, genital sores, vaginal bleeding and vaginal discharge.  Musculoskeletal:  Positive for back pain (low back pain - seeing ORtho, on Voltaren). Negative for arthralgias, gait problem and joint swelling.  Skin:  Negative for color change and rash.  Neurological:  Negative for dizziness, tremors, light-headedness and headaches.  Hematological:  Negative for adenopathy. Does not bruise/bleed easily.  Psychiatric/Behavioral:  Negative for dysphoric mood and sleep disturbance. The patient is not nervous/anxious.     Patient Active Problem List   Diagnosis Date Noted   DDD (degenerative disc disease), lumbar 11/14/2022   Legally blind in left eye, as defined in [place] 11/14/2022   Dysphagia    Prediabetes 04/28/2020   Eczema 04/27/2020   S/P TAH (total abdominal hysterectomy) 04/26/2020   Polyp of colon    History of renal calculi 12/24/2019   Mild intermittent asthma without complication 12/24/2019   Chronic idiopathic constipation 12/24/2019   Primary osteoarthritis of left knee 12/24/2019   Mixed hyperlipidemia 12/24/2019   BMI 35.0-35.9,adult 12/24/2019   History of  palpitations 12/24/2019    Allergies  Allergen Reactions   Aspirin Hives   Penicillins Hives    Past Surgical History:  Procedure Laterality Date   ABDOMINAL HYSTERECTOMY  2018   Cervix absent - only ovaries remain   COLONOSCOPY WITH PROPOFOL N/A 01/24/2020   Procedure: COLONOSCOPY WITH PROPOFOL;  Surgeon: Midge Minium, MD;  Location: [facility];  Service: Endoscopy;  Laterality: N/A;  priority 4   ESOPHAGOGASTRODUODENOSCOPY (EGD) WITH  PROPOFOL N/A 12/10/2020   Procedure: ESOPHAGOGASTRODUODENOSCOPY (EGD) WITH PROPOFOL;  Surgeon: Midge Minium, MD;  Location: [facility];  Service: Endoscopy;  Laterality: N/A;   POLYPECTOMY  01/24/2020   Procedure: POLYPECTOMY;  Surgeon: Midge Minium, MD;  Location: [facility];  Service: Endoscopy;;    Social History   Tobacco Use   Smoking status: Never   Smokeless tobacco: Never  Vaping Use   Vaping status: Never Used  Substance Use Topics   Alcohol use: Never   Drug use: Never     Medication list has been reviewed and updated.  Current Meds  Medication Sig   baclofen (LIORESAL) 10 MG tablet Take 1 tablet (10 mg total) by mouth 3 (three) times daily.   diclofenac (VOLTAREN) 75 MG EC tablet Take 75 mg by mouth 2 (two) times daily.   EPINEPHrine 0.3 mg/0.3 mL IJ SOAJ injection See admin instructions.   mometasone (ELOCON) 0.1 % ointment Apply topically daily.   naproxen (NAPROSYN) 500 MG tablet Take 1 tablet (500 mg total) by mouth 2 (two) times daily.       11/14/2022    9:44 AM 07/26/2021    8:09 AM 06/24/2021    8:58 AM 10/23/2020    8:22 AM  GAD 7 : Generalized Anxiety Score  Nervous, Anxious, on Edge 1 1 1  0  Control/stop worrying 1 1 1  0  Worry too much - different things 1 1 0 0  Trouble relaxing 1 1 0 0  Restless 1 0 0 0  Easily annoyed or irritable 1 1 1  0  Afraid - awful might happen 1 1 1  0  Total GAD 7 Score 7 6 4  0  Anxiety Difficulty Not difficult at all Not difficult at all         11/14/2022    9:44 AM 07/26/2021    8:08 AM 06/24/2021    8:57 AM  Depression screen PHQ 2/9  Decreased Interest 1 1 1   Down, Depressed, Hopeless 1 1 1   PHQ - 2 Score 2 2 2   Altered sleeping 2 3 1   Tired, decreased energy 2 0 1  Change in appetite 0 0 1  Feeling bad or failure about yourself  0 0 0  Trouble concentrating 1 0 0  Moving slowly or fidgety/restless 0 0 0  Suicidal thoughts 0 0 0  PHQ-9 Score 7 5 5   Difficult doing work/chores Very  difficult Not difficult at all Somewhat difficult    BP Readings from Last 3 Encounters:  11/14/22 126/66  09/18/22 (!) 160/84  03/20/22 (!) 159/97    Physical Exam Vitals and nursing note reviewed.  Constitutional:      General: She is not in acute distress.    Appearance: She is well-developed.  HENT:     Head: Normocephalic and atraumatic.     Right Ear: Tympanic membrane and ear canal normal.     Left Ear: Tympanic membrane and ear canal normal.     Nose:     Right Sinus: No maxillary sinus  tenderness.     Left Sinus: No maxillary sinus tenderness.  Eyes:     General: No scleral icterus.       Right eye: No discharge.        Left eye: No discharge.     Conjunctiva/sclera: Conjunctivae normal.  Neck:     Thyroid: No thyromegaly.     Vascular: No carotid bruit.  Cardiovascular:     Rate and Rhythm: Normal rate and regular rhythm.     Pulses: Normal pulses.     Heart sounds: Normal heart sounds.  Pulmonary:     Effort: Pulmonary effort is normal. No respiratory distress.     Breath sounds: No wheezing.  Chest:  Breasts:    Right: No mass, nipple discharge, skin change or tenderness.     Left: No mass, nipple discharge, skin change or tenderness.  Abdominal:     General: Bowel sounds are normal.     Palpations: Abdomen is soft.     Tenderness: There is no abdominal tenderness.  Musculoskeletal:        General: Normal range of motion.     Cervical back: Normal range of motion. No erythema.     Right lower leg: No edema.     Left lower leg: No edema.  Lymphadenopathy:     Cervical: No cervical adenopathy.  Skin:    General: Skin is warm and dry.     Capillary Refill: Capillary refill takes less than 2 seconds.     Findings: No rash.  Neurological:     General: No focal deficit present.     Mental Status: She is alert and oriented to person, place, and time.     Cranial Nerves: No cranial nerve deficit.     Sensory: No sensory deficit.     Deep Tendon  Reflexes: Reflexes are normal and symmetric.  Psychiatric:        Attention and Perception: Attention normal.        Mood and Affect: Mood normal.     Wt Readings from Last 3 Encounters:  11/14/22 241 lb (109.3 kg)  09/18/22 235 lb 0.2 oz (106.6 kg)  03/20/22 235 lb (106.6 kg)    BP 126/66   Pulse 90   Ht 5\' 9"  (1.753 m)   Wt 241 lb (109.3 kg)   SpO2 97%   BMI 35.59 kg/m   Assessment and Plan:  Problem List Items Addressed This Visit     Prediabetes    Glucoses controlled with diet and exercise. Lab Results  Component Value Date   HGBA1C 5.9 (H) 07/26/2021         Relevant Orders   Comprehensive metabolic panel   Hemoglobin A1c   Mixed hyperlipidemia    Lipids managed with diet. Lab Results  Component Value Date   LDLCALC 186 (H) 07/26/2021         Relevant Orders   Lipid panel   Legally blind in left eye, as defined in [place]   DDD (degenerative disc disease), lumbar    Seeing [facility] in [place] S/p steroid taper; now taking Voltaren with benefit      Relevant Medications   diclofenac (VOLTAREN) 75 MG EC tablet   Other Visit Diagnoses     Annual physical exam    -  Primary   CPX form to be completed for [facility] contract   Relevant Orders   CBC with Differential/Platelet   Comprehensive metabolic panel   Hemoglobin A1c   Lipid panel  TSH   QuantiFERON-TB Gold Plus   Rubella Antibody, IgM   Encounter for screening mammogram for breast cancer       Relevant Orders   MM 3D SCREENING MAMMOGRAM BILATERAL BREAST   Screening for tuberculosis       Relevant Orders   QuantiFERON-TB Gold Plus   Rubella immune status not known       Relevant Orders   Rubella Antibody, IgM       No follow-ups on file.    Reubin Milan, MD [facility] [place]

## 2022-11-14 NOTE — Assessment & Plan Note (Signed)
Glucoses controlled with diet and exercise. Lab Results  Component Value Date   HGBA1C 5.9 (H) 07/26/2021

## 2022-11-14 NOTE — Assessment & Plan Note (Signed)
Seeing [facility] in [place] S/p steroid taper; now taking Voltaren with benefit

## 2022-11-16 ENCOUNTER — Encounter: Payer: Self-pay | Admitting: Internal Medicine

## 2022-11-16 LAB — COMPREHENSIVE METABOLIC PANEL
ALT: 6 IU/L (ref 0–32)
AST: 14 IU/L (ref 0–40)
Albumin: 3.8 g/dL (ref 3.8–4.9)
Alkaline Phosphatase: 68 IU/L (ref 44–121)
BUN/Creatinine Ratio: 11 (ref 9–23)
BUN: 12 mg/dL (ref 6–24)
Bilirubin Total: <0.2 mg/dL (ref 0.0–1.2)
CO2: 22 mmol/L (ref 20–29)
Calcium: 9.5 mg/dL (ref 8.7–10.2)
Chloride: 105 mmol/L (ref 96–106)
Creatinine, Ser: 1.11 mg/dL — ABNORMAL HIGH (ref 0.57–1.00)
Globulin, Total: 3.3 g/dL (ref 1.5–4.5)
Glucose: 111 mg/dL — ABNORMAL HIGH (ref 70–99)
Potassium: 4.2 mmol/L (ref 3.5–5.2)
Sodium: 139 mmol/L (ref 134–144)
Total Protein: 7.1 g/dL (ref 6.0–8.5)
eGFR: 59 mL/min/{1.73_m2} — ABNORMAL LOW (ref >59)

## 2022-11-16 LAB — CBC WITH DIFFERENTIAL/PLATELET
Basophils Absolute: 0 10*3/uL (ref 0.0–0.2)
Basos: 0 %
EOS (ABSOLUTE): 0.1 10*3/uL (ref 0.0–0.4)
Eos: 1 %
Hematocrit: 38.1 % (ref 34.0–46.6)
Hemoglobin: 12.5 g/dL (ref 11.1–15.9)
Immature Grans (Abs): 0 10*3/uL (ref 0.0–0.1)
Immature Granulocytes: 0 %
Lymphocytes Absolute: 2.4 10*3/uL (ref 0.7–3.1)
Lymphs: 42 %
MCH: 28 pg (ref 26.6–33.0)
MCHC: 32.8 g/dL (ref 31.5–35.7)
MCV: 85 fL (ref 79–97)
Monocytes Absolute: 0.4 10*3/uL (ref 0.1–0.9)
Monocytes: 8 %
Neutrophils Absolute: 2.8 10*3/uL (ref 1.4–7.0)
Neutrophils: 49 %
Platelets: 299 10*3/uL (ref 150–450)
RBC: 4.46 x10E6/uL (ref 3.77–5.28)
RDW: 14.7 % (ref 11.7–15.4)
WBC: 5.7 10*3/uL (ref 3.4–10.8)

## 2022-11-16 LAB — QUANTIFERON-TB GOLD PLUS
QuantiFERON Mitogen Value: >10 IU/mL
QuantiFERON Nil Value: 0.04 IU/mL
QuantiFERON TB1 Ag Value: 0.06 IU/mL
QuantiFERON TB2 Ag Value: 0.07 IU/mL
QuantiFERON-TB Gold Plus: NEGATIVE

## 2022-11-16 LAB — TSH: TSH: 3.19 u[IU]/mL (ref 0.450–4.500)

## 2022-11-16 LAB — LIPID PANEL
Chol/HDL Ratio: 3.8 ratio (ref 0.0–4.4)
Cholesterol, Total: 240 mg/dL — ABNORMAL HIGH (ref 100–199)
HDL: 64 mg/dL (ref >39)
LDL Chol Calc (NIH): 152 mg/dL — ABNORMAL HIGH (ref 0–99)
Triglycerides: 135 mg/dL (ref 0–149)
VLDL Cholesterol Cal: 24 mg/dL (ref 5–40)

## 2022-11-16 LAB — HEMOGLOBIN A1C
Est. average glucose Bld gHb Est-mCnc: 134 mg/dL
Hgb A1c MFr Bld: 6.3 % — ABNORMAL HIGH (ref 4.8–5.6)

## 2022-11-16 LAB — RUBELLA ANTIBODY, IGM: Rubella IgM: <20 AU/mL (ref 0.0–19.9)

## 2022-12-13 ENCOUNTER — Ambulatory Visit
Admission: RE | Admit: 2022-12-13 | Discharge: 2022-12-13 | Disposition: A | Discharge status: Home / Self Care | Payer: 59 | Source: Ambulatory Visit | Attending: Internal Medicine | Admitting: Internal Medicine

## 2022-12-13 DIAGNOSIS — Z1231 Encounter for screening mammogram for malignant neoplasm of breast: Secondary | ICD-10-CM | POA: Insufficient documentation

## 2023-03-24 IMAGING — MR MR PELVIS WO/W CM
13 of 16 series · 36 of 48 positions shown · IV contrast (gadavist)
Comparison: Ultrasound exam 07/07/2021

CLINICAL DATA: 1.9 cm right ovarian lesion on recent ultrasound.

EXAM:
MRI PELVIS WITHOUT AND WITH CONTRAST
TECHNIQUE: Multiplanar multisequence MR imaging of the pelvis was performed
both before and after administration of intravenous contrast.
CONTRAST:  10mL GADAVIST GADOBUTROL 1 MMOL/ML IV SOLN

[Series 2: T2 · coronal · 5.0mm · 1.41mm/px · 1 of 34 slices shown (1 of 3)]
[im 1/34]
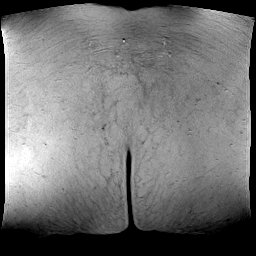

[Series 3: T2 · coronal · 5.0mm · 0.69mm/px · 1 of 31 slices shown (2 of 3)]
[im 1/31]
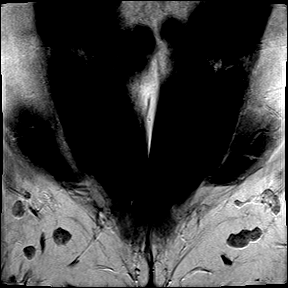

[Series 4: T2 · axial · 5.0mm · 0.75mm/px · z∈[-115,+119]mm · 2 of 40 slices shown (3 of 3)]
[im 1/40]
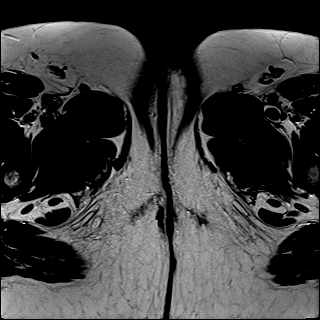
[im 40/40]
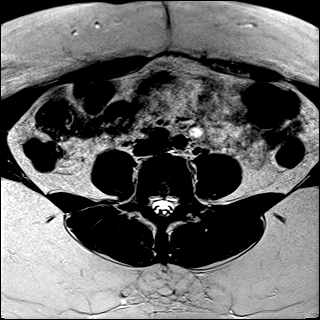

[Series 5: T2 fat-sat · axial · 5.0mm · 0.90mm/px · z∈[-116,+118]mm · 2 of 40 slices shown]
[im 1/40]
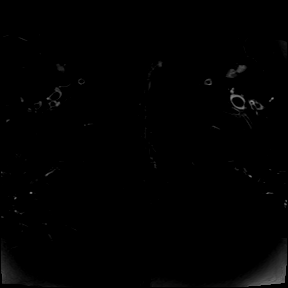
[im 40/40]
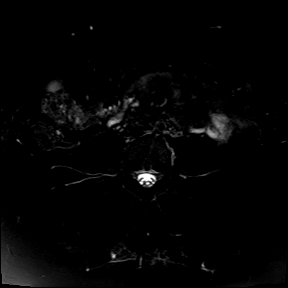

[Series 6: sag tse · sagittal · 5.0mm · 0.75mm/px · 1 of 33 slices shown]
[im 1/33]
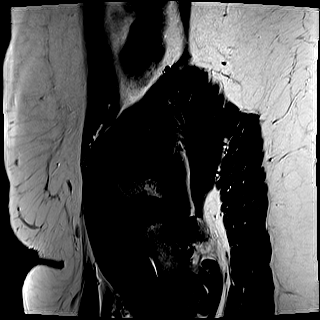

[Series 7: ax dwi_tracew · axial · 5.0mm · 1.14mm/px · z∈[-115,+119]mm · 5 of 120 slices shown]
[im 1/120]
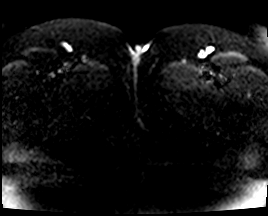
[im 30/120]
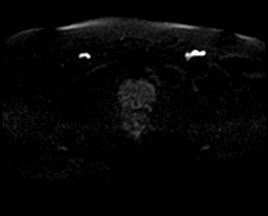
[im 60/120]
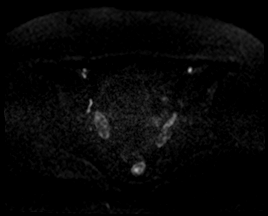
[im 90/120]
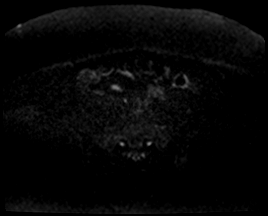
[im 120/120]
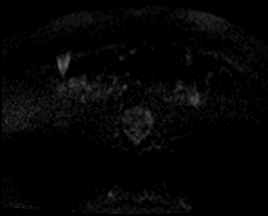

[Series 8: ax dwi_adc · axial · 5.0mm · 1.14mm/px · z∈[-115,+119]mm · 2 of 40 slices shown]
[im 1/40]
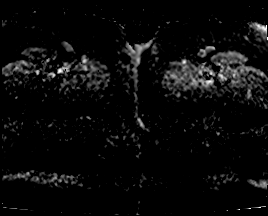
[im 40/40]
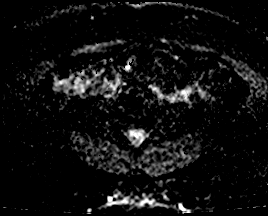

[Series 9: DIXON · axial · 3.0mm · 1.19mm/px · z∈[-118,+118]mm · 4 of 80 slices shown (1 of 2)]
[im 1/80]
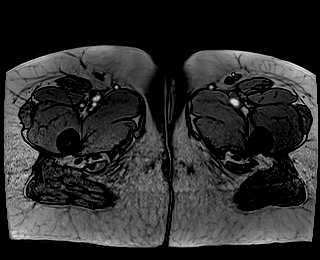
[im 27/80]
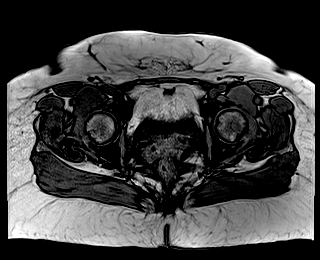
[im 53/80]
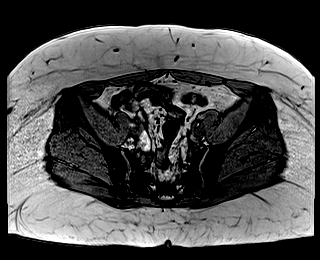
[im 80/80]
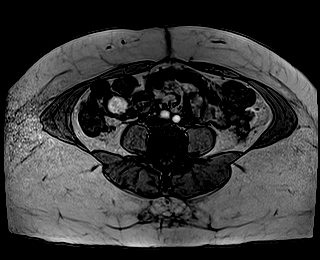

[Series 10: DIXON · axial · 3.0mm · 1.19mm/px · z∈[-118,+118]mm · 4 of 80 slices shown (2 of 2)]
[im 1/80]
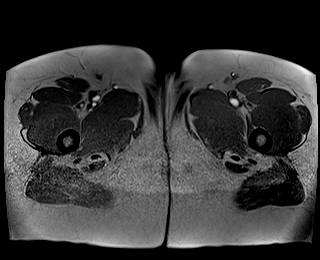
[im 27/80]
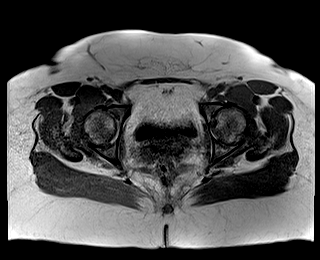
[im 53/80]
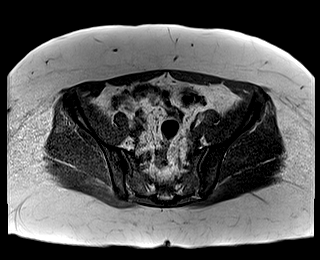
[im 80/80]
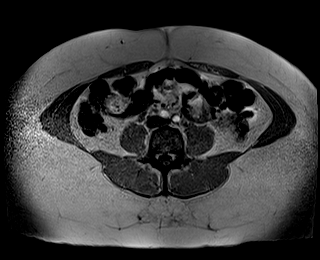

[Series 12: T1 dynamic fat-sat · axial · 3.0mm · 0.47mm/px · z∈[-133,+128]mm · 4 of 88 slices shown]
[im 1/88]
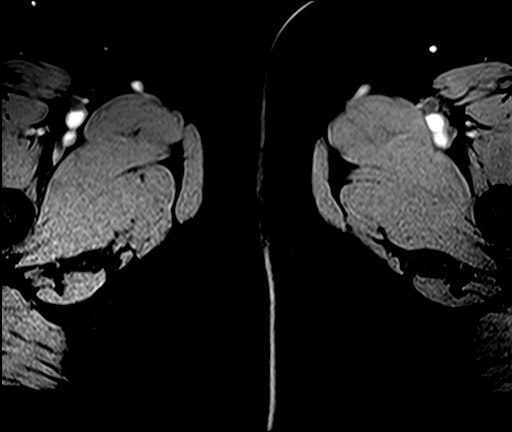
[im 30/88]
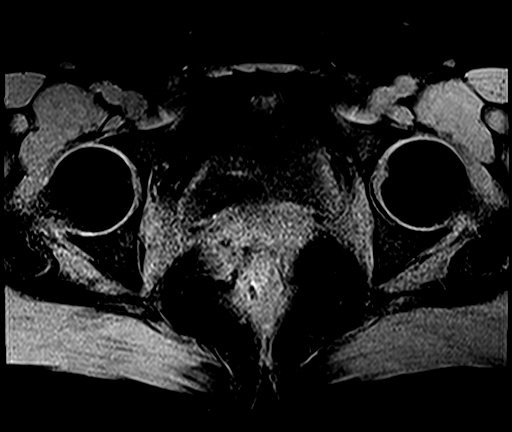
[im 59/88]
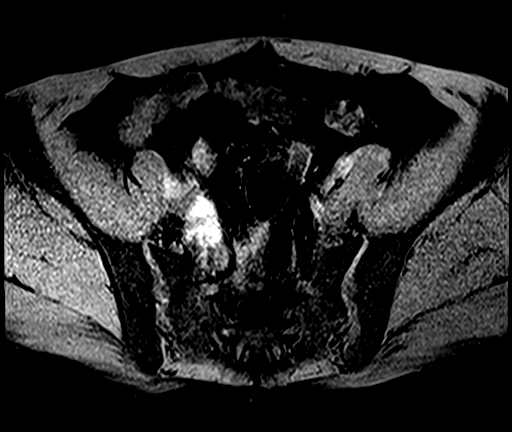
[im 88/88]
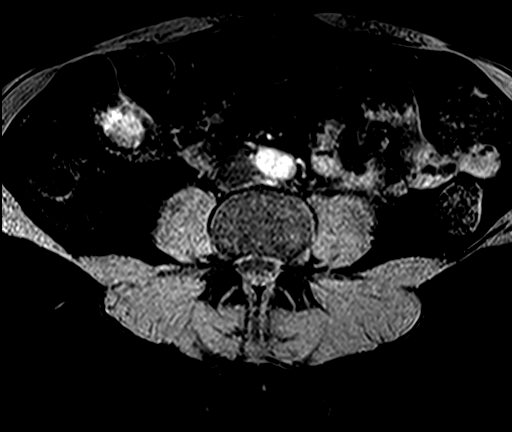

[Series 14: T1 dynamic post-contrast · axial · 3.0mm · 0.47mm/px · z∈[-133,+128]mm · 4 of 88 slices shown (1 of 3)]
[im 1/88]
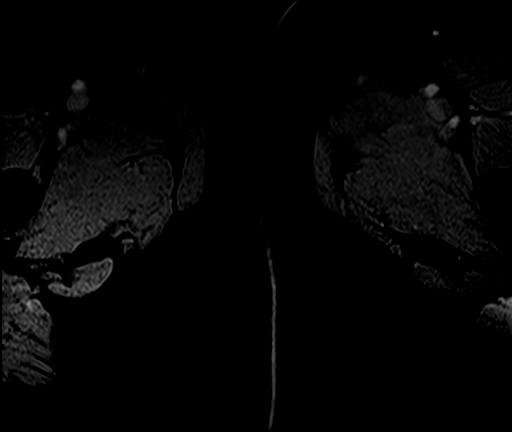
[im 30/88]
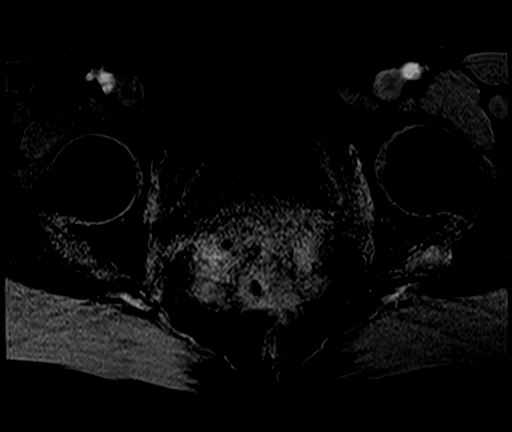
[im 59/88]
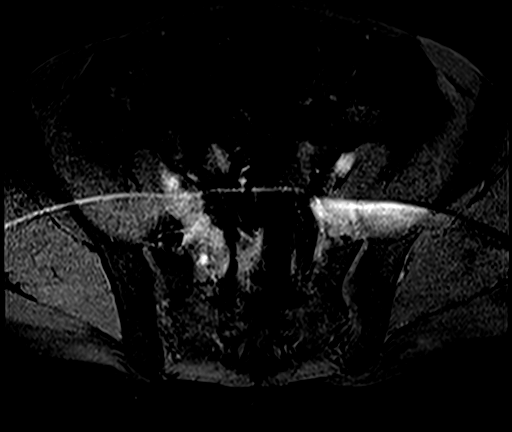
[im 88/88]
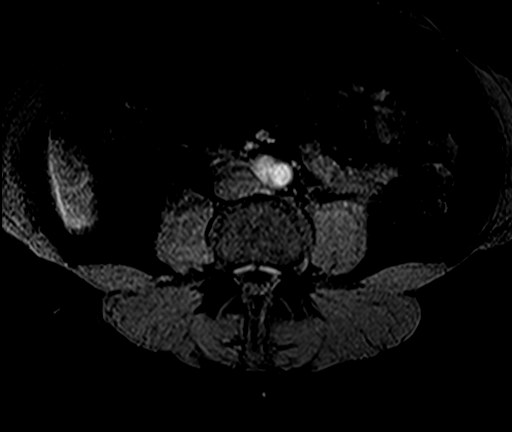

[Series 16: T1 dynamic post-contrast · axial · 3.0mm · 0.47mm/px · z∈[-133,+128]mm · 4 of 88 slices shown (2 of 3)]
[im 1/88]
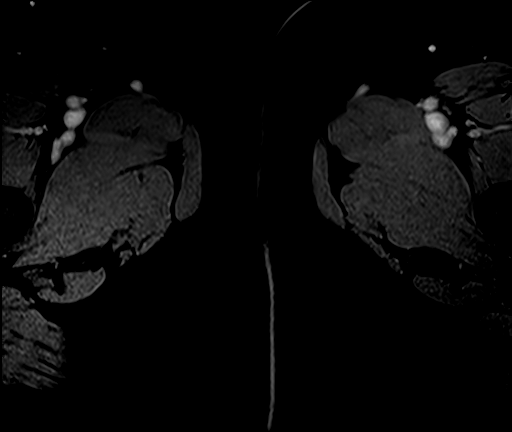
[im 30/88]
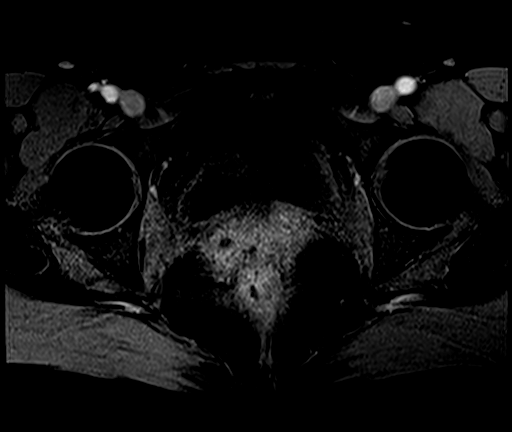
[im 59/88]
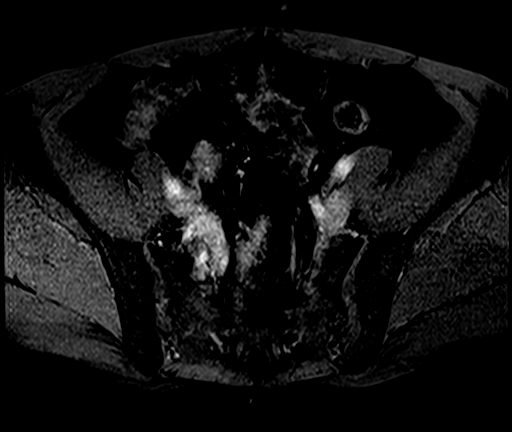
[im 88/88]
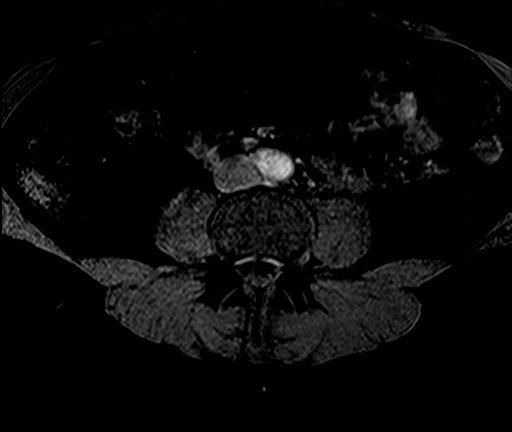

[Series 18: T1 dynamic post-contrast · axial · 3.0mm · 0.47mm/px · z∈[-133,-46]mm · 2 of 88 slices shown (3 of 3)]
[im 1/88]
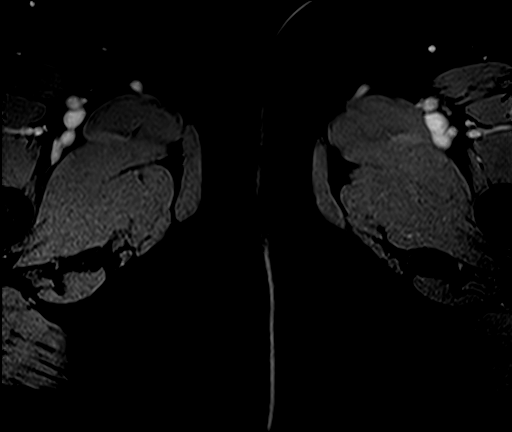
[im 30/88]
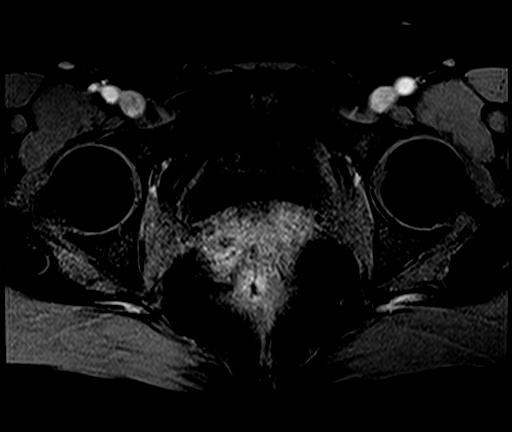

[36 of 48 positions shown; findings below may reference images not displayed]

FINDINGS: Urinary Tract: The urinary bladder appears normal for the degree of
distention. No evidence for urethral diverticulum.

Bowel:  Unremarkable visualized pelvic bowel loops.

Vascular/Lymphatic: No pathologically enlarged lymph nodes. No
significant vascular abnormality seen.

Reproductive:  Uterus surgically absent.

Right ovary measures 3.2 x 3.8 x 1.7 cm. Within the right ovarian
parenchyma is a 1.5 x 1.5 x 1.7 cm T2 hypointense, T1 isointense
structure that shows enhancement characteristics similar to
background ovarian parenchyma.

Left ovary measures 4.1 x 1.8 x 2.9 cm. Tiny follicle noted. No
suspicious left adnexal mass lesion.

Other:  No intraperitoneal free fluid.

Musculoskeletal: No focal suspicious marrow enhancement within the
visualized bony anatomy.
IMPRESSION: 1. 1.5 x 1.5 x 1.7 cm T2 hypointense, T1 isointense structure in the
right ovarian parenchyma shows enhancement characteristics similar
to background ovarian parenchyma. This may represent a small ovarian
fibroma or fibrothecoma. Given that the lesion is visible by
ultrasound, follow-up ultrasound in 3 months could be used to ensure
stability.
2. Previous hysterectomy.

## 2023-06-12 ENCOUNTER — Ambulatory Visit: Payer: 59

## 2023-06-12 ENCOUNTER — Ambulatory Visit: Payer: Self-pay

## 2023-06-27 ENCOUNTER — Telehealth: Payer: 59 | Admitting: Physician Assistant

## 2023-06-27 DIAGNOSIS — J45901 Unspecified asthma with (acute) exacerbation: Secondary | ICD-10-CM | POA: Diagnosis not present

## 2023-06-27 DIAGNOSIS — B9689 Other specified bacterial agents as the cause of diseases classified elsewhere: Secondary | ICD-10-CM | POA: Diagnosis not present

## 2023-06-27 DIAGNOSIS — J208 Acute bronchitis due to other specified organisms: Secondary | ICD-10-CM | POA: Diagnosis not present

## 2023-06-27 MED ORDER — AZITHROMYCIN 250 MG PO TABS: ORAL_TABLET | ORAL | 0 refills | Status: AC

## 2023-06-27 MED ORDER — BENZONATATE 100 MG PO CAPS
100.0000 mg | ORAL_CAPSULE | Freq: Three times a day (TID) | ORAL | 0 refills | Status: DC | PRN
Start: 2023-06-27 — End: 2024-02-19

## 2023-06-27 MED ORDER — PREDNISONE 20 MG PO TABS: 40.0000 mg | ORAL_TABLET | Freq: Every day | ORAL | 0 refills | Status: DC

## 2023-06-27 NOTE — Progress Notes (Signed)
 E-Visit for Cough   We are sorry that you are not feeling well.  Here is how we plan to help!  Based on your presentation I believe you most likely have A cough due to bacteria.  When patients have a productive cough with a change in color or increased sputum production, we are concerned about bacterial bronchitis.  If left untreated it can progress to pneumonia.  If your symptoms do not improve with your treatment plan it is important that you contact your provider.   I have prescribed Azithromyin 250 mg: two tablets now and then one tablet daily for 4 additonal days    In addition you may use A prescription cough medication called Tessalon Perles 100mg . You may take 1-2 capsules every 8 hours as needed for your cough.  I am also concerned for a full-blown exacerbation of your asthma. As such, I am adding on a short course of prednisone to take as directed.   From your responses in the eVisit questionnaire you describe inflammation in the upper respiratory tract which is causing a significant cough.  This is commonly called Bronchitis and has four common causes:   Allergies Viral Infections Acid Reflux Bacterial Infection Allergies, viruses and acid reflux are treated by controlling symptoms or eliminating the cause. An example might be a cough caused by taking certain blood pressure medications. You stop the cough by changing the medication. Another example might be a cough caused by acid reflux. Controlling the reflux helps control the cough.  USE OF BRONCHODILATOR ("RESCUE") INHALERS: There is a risk from using your bronchodilator too frequently.  The risk is that over-reliance on a medication which only relaxes the muscles surrounding the breathing tubes can reduce the effectiveness of medications prescribed to reduce swelling and congestion of the tubes themselves.  Although you feel brief relief from the bronchodilator inhaler, your asthma may actually be worsening with the tubes becoming  more swollen and filled with mucus.  This can delay other crucial treatments, such as oral steroid medications. If you need to use a bronchodilator inhaler daily, several times per day, you should discuss this with your provider.  There are probably better treatments that could be used to keep your asthma under control.     HOME CARE Only take medications as instructed by your medical team. Complete the entire course of an antibiotic. Drink plenty of fluids and get plenty of rest. Avoid close contacts especially the very young and the elderly Cover your mouth if you cough or cough into your sleeve. Always remember to wash your hands A steam or ultrasonic humidifier can help congestion.   GET HELP RIGHT AWAY IF: You develop worsening fever. You become short of breath You cough up blood. Your symptoms persist after you have completed your treatment plan MAKE SURE YOU  Understand these instructions. Will watch your condition. Will get help right away if you are not doing well or get worse.    Thank you for choosing an e-visit.  Your e-visit answers were reviewed by a board certified advanced clinical practitioner to complete your personal care plan. Depending upon the condition, your plan could have included both over the counter or prescription medications.  Please review your pharmacy choice. Make sure the pharmacy is open so you can pick up prescription now. If there is a problem, you may contact your provider through [employer] and have the prescription routed to another pharmacy.  Your safety is important to [place]. If you have drug  allergies check your prescription carefully.   For the next 24 hours you can use MyChart to ask questions about today's visit, request a non-urgent call back, or ask for a work or school excuse. You will get an email in the next two days asking about your experience. I hope that your e-visit has been valuable and will speed your recovery.

## 2023-06-27 NOTE — Progress Notes (Signed)
 I have spent 5 minutes in review of e-visit questionnaire, review and updating patient chart, medical decision making and response to patient.   Piedad Climes, PA-C

## 2024-01-30 ENCOUNTER — Other Ambulatory Visit: Payer: Self-pay | Admitting: Orthopedic Surgery

## 2024-01-30 DIAGNOSIS — M4807 Spinal stenosis, lumbosacral region: Secondary | ICD-10-CM

## 2024-02-04 ENCOUNTER — Ambulatory Visit

## 2024-02-19 ENCOUNTER — Ambulatory Visit
Admission: EM | Admit: 2024-02-19 | Discharge: 2024-02-19 | Disposition: A | Discharge status: Home / Self Care | Attending: Physician Assistant | Admitting: Physician Assistant

## 2024-02-19 ENCOUNTER — Encounter: Payer: Self-pay | Admitting: Emergency Medicine

## 2024-02-19 DIAGNOSIS — I10 Essential (primary) hypertension: Secondary | ICD-10-CM | POA: Insufficient documentation

## 2024-02-19 DIAGNOSIS — M545 Low back pain, unspecified: Secondary | ICD-10-CM | POA: Insufficient documentation

## 2024-02-19 DIAGNOSIS — R350 Frequency of micturition: Secondary | ICD-10-CM | POA: Diagnosis not present

## 2024-02-19 DIAGNOSIS — M5412 Radiculopathy, cervical region: Secondary | ICD-10-CM | POA: Diagnosis not present

## 2024-02-19 DIAGNOSIS — R10A2 Flank pain, left side: Secondary | ICD-10-CM | POA: Diagnosis not present

## 2024-02-19 DIAGNOSIS — G8929 Other chronic pain: Secondary | ICD-10-CM | POA: Insufficient documentation

## 2024-02-19 DIAGNOSIS — M48061 Spinal stenosis, lumbar region without neurogenic claudication: Secondary | ICD-10-CM | POA: Insufficient documentation

## 2024-02-19 LAB — URINALYSIS, W/ REFLEX TO CULTURE (INFECTION SUSPECTED)
Glucose, UA: NEGATIVE mg/dL
Hgb urine dipstick: NEGATIVE
Ketones, ur: NEGATIVE mg/dL
Leukocytes,Ua: NEGATIVE
Nitrite: NEGATIVE
Protein, ur: NEGATIVE mg/dL
RBC / HPF: NONE SEEN RBC/hpf (ref 0–5)
Specific Gravity, Urine: 1.02 (ref 1.005–1.030)
WBC, UA: NONE SEEN WBC/hpf (ref 0–5)
pH: 6 (ref 5.0–8.0)

## 2024-02-19 MED ORDER — DEXAMETHASONE SOD PHOSPHATE PF 10 MG/ML IJ SOLN
10.0000 mg | Freq: Once | INTRAMUSCULAR | Status: AC
  Administered 2024-02-19: 10 mg via INTRAMUSCULAR

## 2024-02-19 MED ORDER — DICLOFENAC SODIUM 75 MG PO TBEC: 75.0000 mg | DELAYED_RELEASE_TABLET | Freq: Two times a day (BID) | ORAL | 0 refills | Status: AC | PRN

## 2024-02-19 NOTE — ED Provider Notes (Signed)
 [facility]    CSN: [id number] Arrival date & time: 02/19/24  1158      History   Chief Complaint Chief Complaint  Patient presents with   Back Pain    HPI Kendra Ford is a 53 y.o. female with history of chronic back pain.  She presents today for pain in the left flank area x 1.5 weeks which she thinks may  not be related to her chronic back pain. Has also had urinary frequency without pain or hematuria.   Patient recently seen by PCP for back pain last month.  Reports that her lower back is always hurting and pain radiates into both lower extremities.  No weakness or numbness.  Currently, she is taking Robaxin and alternating ibuprofen  and Tylenol . Patient had consult with orthopedic surgery on 01/30/24 and had x-ray that showed significant arthritis in back and spinal stenosis. Patient is scheduled to have an MRI of her L-spine on 02/27/2024.  Patient also reports over the past week and a half she has had neck pain with radiation down the left upper extremity.  No numbness, weakness or tingling.  History of cervical disc disorder last year.  HPI  Past Medical History:  Diagnosis Date   Arthritis    left knee   Asthma    Chronic back pain    Hyperlipidemia    Hypertension    Recurrent kidney stones     Patient Active Problem List   Diagnosis Date Noted   DDD (degenerative disc disease), lumbar 11/14/2022   Legally blind in left eye, as defined in [place]  11/14/2022   Dysphagia    Prediabetes 04/28/2020   Eczema 04/27/2020   S/P TAH (total abdominal hysterectomy) 04/26/2020   Polyp of colon    History of renal calculi 12/24/2019   Mild intermittent asthma without complication 12/24/2019   Chronic idiopathic constipation 12/24/2019   Primary osteoarthritis of left knee 12/24/2019   Mixed hyperlipidemia 12/24/2019   BMI 35.0-35.9,adult 12/24/2019   History of palpitations 12/24/2019    Past Surgical History:  Procedure Laterality Date    ABDOMINAL HYSTERECTOMY  2018   Cervix absent - only ovaries remain   COLONOSCOPY WITH PROPOFOL  N/A 01/24/2020   Procedure: COLONOSCOPY WITH PROPOFOL ;  Surgeon: Jinny Carmine, MD;  Location: [facility];  Service: Endoscopy;  Laterality: N/A;  priority 4   ESOPHAGOGASTRODUODENOSCOPY (EGD) WITH PROPOFOL  N/A 12/10/2020   Procedure: ESOPHAGOGASTRODUODENOSCOPY (EGD) WITH PROPOFOL ;  Surgeon: Jinny Carmine, MD;  Location: [facility];  Service: Endoscopy;  Laterality: N/A;   POLYPECTOMY  01/24/2020   Procedure: POLYPECTOMY;  Surgeon: Jinny Carmine, MD;  Location: [facility];  Service: Endoscopy;;    OB History   No obstetric history on file.      Home Medications    Prior to Admission medications   Medication Sig Start Date End Date Taking? Authorizing Provider  diclofenac (VOLTAREN) 75 MG EC tablet Take 1 tablet (75 mg total) by mouth 2 (two) times daily as needed for moderate pain (pain score 4-6). 02/19/24  Yes Arvis Jolan NOVAK, PA-C  methocarbamol (ROBAXIN) 500 MG tablet Take 500 mg by mouth. 01/30/24 04/29/24 Yes [provider]  EPINEPHrine 0.3 mg/0.3 mL IJ SOAJ injection See admin instructions. 09/25/20   [provider]  mometasone  (ELOCON ) 0.1 % ointment Apply topically daily. 10/12/22   Justus Leita DEL, MD    Family History Family History  Problem Relation Age of Onset   Hypertension Mother    Hyperlipidemia Mother  Liver disease Mother    Breast cancer Neg Hx     Social History Social History   Tobacco Use   Smoking status: Never   Smokeless tobacco: Never  Vaping Use   Vaping status: Never Used  Substance Use Topics   Alcohol use: Never   Drug use: Never     Allergies   Aspirin and Penicillins   Review of Systems Review of Systems  Cardiovascular:  Negative for chest pain.  Gastrointestinal:  Negative for abdominal pain, nausea and vomiting.  Genitourinary:  Positive for flank pain and frequency. Negative for  difficulty urinating, dysuria, hematuria and urgency.  Musculoskeletal:  Positive for back pain and neck pain. Negative for gait problem and neck stiffness.  Neurological:  Negative for weakness and numbness.     Physical Exam Triage Vital Signs ED Triage Vitals  Encounter Vitals Group     BP 02/19/24 1337 (!) 181/85     Girls Systolic BP Percentile --      Girls Diastolic BP Percentile --      Boys Systolic BP Percentile --      Boys Diastolic BP Percentile --      Pulse Rate 02/19/24 1337 63     Resp 02/19/24 1337 16     Temp 02/19/24 1337 98.6 F (37 C)     Temp Source 02/19/24 1337 Oral     SpO2 02/19/24 1337 95 %     Weight --      Height --      Head Circumference --      Peak Flow --      Pain Score 02/19/24 1335 8     Pain Loc --      Pain Education --      Exclude from Growth Chart --    No data found.  Updated Vital Signs BP (!) 181/85 (BP Location: Right Arm)   Pulse 63   Temp 98.6 F (37 C) (Oral)   Resp 16   SpO2 95%    Physical Exam Vitals and nursing note reviewed.  Constitutional:      General: She is not in acute distress.    Appearance: Normal appearance. She is not ill-appearing or toxic-appearing.  HENT:     Head: Normocephalic and atraumatic.  Eyes:     General: No scleral icterus.       Right eye: No discharge.        Left eye: No discharge.     Conjunctiva/sclera: Conjunctivae normal.  Cardiovascular:     Rate and Rhythm: Normal rate and regular rhythm.     Heart sounds: Normal heart sounds.  Pulmonary:     Effort: Pulmonary effort is normal. No respiratory distress.     Breath sounds: Normal breath sounds.  Abdominal:     Palpations: Abdomen is soft.     Tenderness: There is no abdominal tenderness. There is left CVA tenderness. There is no right CVA tenderness.  Musculoskeletal:     Cervical back: Neck supple. Tenderness (generalized TTP left side of neck and trap) present. Pain with movement present. Decreased range of motion.      Lumbar back: Tenderness (generalized lumbar region. subjective left CVA tenderness) present. Decreased range of motion. Negative right straight leg raise test and negative left straight leg raise test.  Skin:    General: Skin is dry.  Neurological:     General: No focal deficit present.     Mental Status: She is alert. Mental status is at  baseline.     Motor: No weakness.     Gait: Gait normal.  Psychiatric:        Mood and Affect: Mood normal.        Behavior: Behavior normal.      UC Treatments / Results  Labs (all labs ordered are listed, but only abnormal results are displayed) Labs Reviewed  URINALYSIS, W/ REFLEX TO CULTURE (INFECTION SUSPECTED) - Abnormal; Notable for the following components:      Result Value   Bilirubin Urine SMALL (*)    Bacteria, UA FEW (*)    All other components within normal limits    EKG   Radiology  No results found. Imaging Results - X-ray lumbar spine 2 to 3 views (01/30/2024 9:22 AM EDT) Narrative  01/30/2024 11:26 AM EDT  Lumbar spine X-ray: Severe lumbar and lumbosacral facet arthritis, complete loss of L5-S1 disc space, minimal degenerative disc disease, intact pedicles, scoliosis. (01/30/2024)    Procedures Procedures (including critical care time)  Medications Ordered in UC Medications  dexamethasone (DECADRON) injection 10 mg (has no administration in time range)    Initial Impression / Assessment and Plan / UC Course  I have reviewed the triage vital signs and the nursing notes.  Pertinent labs & imaging results that were available during my care of the patient were reviewed by me and considered in my medical decision making (see chart for details).   53 year old female with history of chronic low back pain being followed by orthopedic surgery presents for left flank pain and urinary frequency.  She thinks this is separate from her chronic back issues.  Denies urinary pain.  Also reporting 1-1/2-week history of neck  pain with radiation to the left upper extremity.   Included lumbar xray results and viewed PCP and ortho charts.   BP 181/85. Re-check is 168/94.  Advised keeping a log.  If consistently greater than 140 manage follow-up with PCP to discuss this.  UA negative.   Low back and flank pain likely due to chronic underlying conditions. Advised continuing Robaxin. Start diclofenac daily. She was previously on this medication.   Patient given 10 mg IM dexamethasone.  Advised orthopedic follow up. Has MRI in 1 week. Keep appointment. Handout reviews ED precautions for back and neck pain.    Final Clinical Impressions(s) / UC Diagnoses   Final diagnoses:  Chronic bilateral low back pain, unspecified whether sciatica present  Spinal stenosis of lumbar region, unspecified whether neurogenic claudication present  Left flank pain  Cervical radiculopathy  Urinary frequency  Essential hypertension     Discharge Instructions      - Urine does not show an infection.  There is also no blood in the urine so a kidney stone is unlikely. - Back pain is likely related to chronic underlying arthritis and stenosis.  Keep your appointment on 10/28 for MRI and follow-up with orthopedic surgery.  Continue Robaxin.  Also continue Tylenol .  I sent an anti-inflammatory medication to the pharmacy.  You may take this or over-the-counter Aleve  or ibuprofen  but do not take all these medications together. - May use heat, ice, muscle rubs. - Neck pain is due to a pinched nerve in your neck.  Hopefully the corticosteroid injection we gave you today helps as well as taking anti-inflammatory medicines. - Blood pressure is high today.  It could partially be related to pain.  Check it at home and if it is consistently greater than 140/90 please follow-up with primary care doctor.  NECK PAIN: Stressed avoiding painful activities. This can exacerbate your symptoms and make them worse.  May apply heat to the areas of pain for  some relief. Use medications as directed. Be aware of which medications make you drowsy and do not drive or operate any kind of heavy machinery while using the medication (ie pain medications or muscle relaxers). F/U with PCP for reexamination or return sooner if condition worsens or does not begin to improve over the next few days.   NECK PAIN RED FLAGS: If symptoms get worse than they are right now, you should come back sooner for re-evaluation. If you have increased numbness/ tingling or notice that the numbness/tingling is affecting the legs or saddle region, go to ER. If you ever lose continence go to ER.      BACK PAIN: Stressed avoiding painful activities . RICE (REST, ICE, COMPRESSION, ELEVATION) guidelines reviewed. May alternate ice and heat. Consider use of muscle rubs, Salonpas patches, etc. Use medications as directed including muscle relaxers if prescribed. Take anti-inflammatory medications as prescribed or OTC NSAIDs/Tylenol .  F/u with PCP in 7-10 days for reexamination, and please feel free to call or return to the urgent care at any time for any questions or concerns you may have and we will be happy to help you!   BACK PAIN RED FLAGS: If the back pain acutely worsens or there are any red flag symptoms such as numbness/tingling, leg weakness, saddle anesthesia, or loss of bowel/bladder control, go immediately to the ER. Follow up with us  as scheduled or sooner if the pain does not begin to resolve or if it worsens before the follow up       ED Prescriptions     Medication Sig Dispense Auth. Provider   diclofenac (VOLTAREN) 75 MG EC tablet Take 1 tablet (75 mg total) by mouth 2 (two) times daily as needed for moderate pain (pain score 4-6). 30 tablet Arvis Jolan NOVAK, PA-C      I have reviewed the PDMP during this encounter.   Arvis Jolan NOVAK, PA-C 02/19/24 [phone]

## 2024-02-19 NOTE — ED Triage Notes (Signed)
 Pt presents with mid-lower back pain for 1.5 weeks. She is currently being treated with Robaxin from here PCP.

## 2024-02-19 NOTE — Discharge Instructions (Addendum)
-   Urine does not show an infection.  There is also no blood in the urine so a kidney stone is unlikely. - Back pain is likely related to chronic underlying arthritis and stenosis.  Keep your appointment on 10/28 for MRI and follow-up with orthopedic surgery.  Continue Robaxin.  Also continue Tylenol .  I sent an anti-inflammatory medication to the pharmacy.  You may take this or over-the-counter Aleve  or ibuprofen  but do not take all these medications together. - May use heat, ice, muscle rubs. - Neck pain is due to a pinched nerve in your neck.  Hopefully the corticosteroid injection we gave you today helps as well as taking anti-inflammatory medicines. - Blood pressure is high today.  It could partially be related to pain.  Check it at home and if it is consistently greater than 140/90 please follow-up with primary care doctor.  NECK PAIN: Stressed avoiding painful activities. This can exacerbate your symptoms and make them worse.  May apply heat to the areas of pain for some relief. Use medications as directed. Be aware of which medications make you drowsy and do not drive or operate any kind of heavy machinery while using the medication (ie pain medications or muscle relaxers). F/U with PCP for reexamination or return sooner if condition worsens or does not begin to improve over the next few days.   NECK PAIN RED FLAGS: If symptoms get worse than they are right now, you should come back sooner for re-evaluation. If you have increased numbness/ tingling or notice that the numbness/tingling is affecting the legs or saddle region, go to ER. If you ever lose continence go to ER.      BACK PAIN: Stressed avoiding painful activities . RICE (REST, ICE, COMPRESSION, ELEVATION) guidelines reviewed. May alternate ice and heat. Consider use of muscle rubs, Salonpas patches, etc. Use medications as directed including muscle relaxers if prescribed. Take anti-inflammatory medications as prescribed or OTC  NSAIDs/Tylenol .  F/u with PCP in 7-10 days for reexamination, and please feel free to call or return to the urgent care at any time for any questions or concerns you may have and we will be happy to help you!   BACK PAIN RED FLAGS: If the back pain acutely worsens or there are any red flag symptoms such as numbness/tingling, leg weakness, saddle anesthesia, or loss of bowel/bladder control, go immediately to the ER. Follow up with us  as scheduled or sooner if the pain does not begin to resolve or if it worsens before the follow up
